# Patient Record
Sex: Female | Born: 1935 | Race: White | Hispanic: No | Marital: Married
Health system: Southern US, Community
[De-identification: ages and names within clinical notes are randomized; demographics above are authoritative.]

## PROBLEM LIST (undated history)

## (undated) DIAGNOSIS — J449 Chronic obstructive pulmonary disease, unspecified: Secondary | ICD-10-CM

## (undated) DIAGNOSIS — Z9981 Dependence on supplemental oxygen: Secondary | ICD-10-CM

## (undated) DIAGNOSIS — J45909 Unspecified asthma, uncomplicated: Secondary | ICD-10-CM

## (undated) DIAGNOSIS — C50919 Malignant neoplasm of unspecified site of unspecified female breast: Secondary | ICD-10-CM

## (undated) DIAGNOSIS — C3490 Malignant neoplasm of unspecified part of unspecified bronchus or lung: Secondary | ICD-10-CM

## (undated) HISTORY — PX: BREAST SURGERY: SHX581

## (undated) HISTORY — PX: APPENDECTOMY: SHX54

---

## 2004-01-20 ENCOUNTER — Ambulatory Visit: Payer: Self-pay | Admitting: Unknown Physician Specialty

## 2005-06-03 ENCOUNTER — Emergency Department: Payer: Self-pay | Admitting: Emergency Medicine

## 2005-09-18 ENCOUNTER — Ambulatory Visit: Payer: Self-pay

## 2005-09-21 ENCOUNTER — Ambulatory Visit: Payer: Self-pay

## 2005-10-01 ENCOUNTER — Ambulatory Visit: Payer: Self-pay | Admitting: Gerontology

## 2006-09-11 ENCOUNTER — Inpatient Hospital Stay: Payer: Self-pay | Admitting: Internal Medicine

## 2007-01-04 ENCOUNTER — Emergency Department: Payer: Self-pay | Admitting: Internal Medicine

## 2011-08-21 ENCOUNTER — Emergency Department: Payer: Self-pay | Admitting: Emergency Medicine

## 2011-08-21 LAB — URINALYSIS, COMPLETE
Bacteria: NONE SEEN
Blood: NEGATIVE
Glucose,UR: NEGATIVE mg/dL (ref 0–75)
Hyaline Cast: 3
Protein: NEGATIVE
RBC,UR: 7 /HPF (ref 0–5)
Specific Gravity: 1.015 (ref 1.003–1.030)

## 2011-08-21 LAB — COMPREHENSIVE METABOLIC PANEL
Albumin: 3.8 g/dL (ref 3.4–5.0)
Alkaline Phosphatase: 98 U/L (ref 50–136)
Anion Gap: 5 — ABNORMAL LOW (ref 7–16)
BUN: 17 mg/dL (ref 7–18)
Chloride: 103 mmol/L (ref 98–107)
Co2: 33 mmol/L — ABNORMAL HIGH (ref 21–32)
Creatinine: 0.61 mg/dL (ref 0.60–1.30)
EGFR (African American): >60
Glucose: 98 mg/dL (ref 65–99)
Osmolality: 283 (ref 275–301)
SGOT(AST): 24 U/L (ref 15–37)
Sodium: 141 mmol/L (ref 136–145)

## 2011-08-21 LAB — CBC
HCT: 44.1 % (ref 35.0–47.0)
HGB: 14.1 g/dL (ref 12.0–16.0)
MCH: 30.2 pg (ref 26.0–34.0)
MCHC: 32 g/dL (ref 32.0–36.0)
MCV: 95 fL (ref 80–100)
RDW: 13.6 % (ref 11.5–14.5)

## 2011-12-31 ENCOUNTER — Inpatient Hospital Stay: Payer: Self-pay | Admitting: Internal Medicine

## 2011-12-31 LAB — URINALYSIS, COMPLETE
Nitrite: NEGATIVE
Ph: 5 (ref 4.5–8.0)
Protein: NEGATIVE
RBC,UR: 1 /HPF (ref 0–5)
WBC UR: 7 /HPF (ref 0–5)

## 2011-12-31 LAB — PROTIME-INR: INR: 0.9

## 2011-12-31 LAB — COMPREHENSIVE METABOLIC PANEL
Albumin: 3.5 g/dL (ref 3.4–5.0)
Alkaline Phosphatase: 89 U/L (ref 50–136)
Anion Gap: 7 (ref 7–16)
BUN: 20 mg/dL — ABNORMAL HIGH (ref 7–18)
Calcium, Total: 8.3 mg/dL — ABNORMAL LOW (ref 8.5–10.1)
Co2: 29 mmol/L (ref 21–32)
EGFR (Non-African Amer.): >60
Glucose: 89 mg/dL (ref 65–99)
Potassium: 3.7 mmol/L (ref 3.5–5.1)
SGOT(AST): 24 U/L (ref 15–37)
SGPT (ALT): 21 U/L (ref 12–78)
Total Protein: 6.4 g/dL (ref 6.4–8.2)

## 2011-12-31 LAB — CBC
HGB: 11.7 g/dL — ABNORMAL LOW (ref 12.0–16.0)
MCH: 31.1 pg (ref 26.0–34.0)
MCHC: 33.4 g/dL (ref 32.0–36.0)
Platelet: 238 10*3/uL (ref 150–440)
RBC: 3.76 10*6/uL — ABNORMAL LOW (ref 3.80–5.20)
RDW: 13.7 % (ref 11.5–14.5)

## 2011-12-31 LAB — TSH: Thyroid Stimulating Horm: 1.22 u[IU]/mL

## 2012-01-01 LAB — BASIC METABOLIC PANEL
Calcium, Total: 8 mg/dL — ABNORMAL LOW (ref 8.5–10.1)
Chloride: 112 mmol/L — ABNORMAL HIGH (ref 98–107)
Creatinine: 0.56 mg/dL — ABNORMAL LOW (ref 0.60–1.30)
EGFR (African American): >60
EGFR (Non-African Amer.): >60
Glucose: 79 mg/dL (ref 65–99)
Sodium: 146 mmol/L — ABNORMAL HIGH (ref 136–145)

## 2012-01-01 LAB — CBC WITH DIFFERENTIAL/PLATELET
Basophil #: 0 10*3/uL (ref 0.0–0.1)
Basophil %: 0.3 %
Eosinophil #: 0.2 10*3/uL (ref 0.0–0.7)
Eosinophil %: 3.7 %
HCT: 30.9 % — ABNORMAL LOW (ref 35.0–47.0)
HGB: 9.8 g/dL — ABNORMAL LOW (ref 12.0–16.0)
Lymphocyte #: 1.7 10*3/uL (ref 1.0–3.6)
Lymphocyte %: 31.9 %
MCH: 29.9 pg (ref 26.0–34.0)
Monocyte %: 9.6 %
Neutrophil #: 2.8 10*3/uL (ref 1.4–6.5)
Platelet: 204 10*3/uL (ref 150–440)
RBC: 3.28 10*6/uL — ABNORMAL LOW (ref 3.80–5.20)
RDW: 13.5 % (ref 11.5–14.5)
WBC: 5.2 10*3/uL (ref 3.6–11.0)

## 2012-01-01 LAB — HEMOGLOBIN: HGB: 10.4 g/dL — ABNORMAL LOW (ref 12.0–16.0)

## 2012-01-02 LAB — CBC WITH DIFFERENTIAL/PLATELET
Basophil #: 0 10*3/uL (ref 0.0–0.1)
Basophil %: 0.5 %
Eosinophil #: 0.2 10*3/uL (ref 0.0–0.7)
Eosinophil %: 3.6 %
HCT: 30.2 % — ABNORMAL LOW (ref 35.0–47.0)
HGB: 9.9 g/dL — ABNORMAL LOW (ref 12.0–16.0)
Lymphocyte %: 28.7 %
Monocyte #: 0.5 x10 3/mm (ref 0.2–0.9)
Monocyte %: 8.1 %
Neutrophil #: 3.4 10*3/uL (ref 1.4–6.5)
Neutrophil %: 59.1 %
Platelet: 208 10*3/uL (ref 150–440)
RBC: 3.25 10*6/uL — ABNORMAL LOW (ref 3.80–5.20)
WBC: 5.7 10*3/uL (ref 3.6–11.0)

## 2012-01-02 LAB — BASIC METABOLIC PANEL
Calcium, Total: 8 mg/dL — ABNORMAL LOW (ref 8.5–10.1)
Co2: 28 mmol/L (ref 21–32)
EGFR (African American): >60
EGFR (Non-African Amer.): >60
Glucose: 84 mg/dL (ref 65–99)
Osmolality: 284 (ref 275–301)
Sodium: 144 mmol/L (ref 136–145)

## 2012-01-03 LAB — BASIC METABOLIC PANEL
Anion Gap: 8 (ref 7–16)
Calcium, Total: 8.4 mg/dL — ABNORMAL LOW (ref 8.5–10.1)
Chloride: 112 mmol/L — ABNORMAL HIGH (ref 98–107)
Co2: 28 mmol/L (ref 21–32)
Creatinine: 0.59 mg/dL — ABNORMAL LOW (ref 0.60–1.30)
EGFR (Non-African Amer.): >60
Glucose: 87 mg/dL (ref 65–99)
Osmolality: 292 (ref 275–301)
Potassium: 3.8 mmol/L (ref 3.5–5.1)
Sodium: 148 mmol/L — ABNORMAL HIGH (ref 136–145)

## 2012-01-03 LAB — CBC WITH DIFFERENTIAL/PLATELET
Basophil %: 0.5 %
Eosinophil %: 4.5 %
HCT: 30.1 % — ABNORMAL LOW (ref 35.0–47.0)
HGB: 10.3 g/dL — ABNORMAL LOW (ref 12.0–16.0)
Monocyte %: 9.1 %
Neutrophil %: 59.5 %
Platelet: 222 10*3/uL (ref 150–440)

## 2012-02-06 ENCOUNTER — Inpatient Hospital Stay: Payer: Self-pay | Admitting: Internal Medicine

## 2012-02-06 LAB — BASIC METABOLIC PANEL
Anion Gap: 5 — ABNORMAL LOW (ref 7–16)
Calcium, Total: 9.3 mg/dL (ref 8.5–10.1)
Chloride: 104 mmol/L (ref 98–107)
Co2: 33 mmol/L — ABNORMAL HIGH (ref 21–32)
Osmolality: 285 (ref 275–301)
Potassium: 3.5 mmol/L (ref 3.5–5.1)

## 2012-02-06 LAB — CBC
HCT: 31.2 % — ABNORMAL LOW (ref 35.0–47.0)
MCHC: 32.1 g/dL (ref 32.0–36.0)
MCV: 91 fL (ref 80–100)
Platelet: 318 10*3/uL (ref 150–440)
RBC: 3.41 10*6/uL — ABNORMAL LOW (ref 3.80–5.20)
RDW: 13.3 % (ref 11.5–14.5)
WBC: 7.1 10*3/uL (ref 3.6–11.0)

## 2012-02-06 LAB — TROPONIN I: Troponin-I: <0.02 ng/mL

## 2012-02-07 LAB — CBC WITH DIFFERENTIAL/PLATELET
Basophil #: 0 10*3/uL (ref 0.0–0.1)
Basophil %: 0.2 %
Eosinophil #: 0 10*3/uL (ref 0.0–0.7)
Eosinophil %: 0.1 %
HCT: 30.9 % — ABNORMAL LOW (ref 35.0–47.0)
HGB: 10.1 g/dL — ABNORMAL LOW (ref 12.0–16.0)
Lymphocyte #: 0.4 10*3/uL — ABNORMAL LOW (ref 1.0–3.6)
Lymphocyte %: 7.5 %
MCH: 29.4 pg (ref 26.0–34.0)
MCV: 90 fL (ref 80–100)
Monocyte #: 0 x10 3/mm — ABNORMAL LOW (ref 0.2–0.9)
Monocyte %: 0.8 %
Neutrophil #: 5 10*3/uL (ref 1.4–6.5)
Platelet: 310 10*3/uL (ref 150–440)
RBC: 3.43 10*6/uL — ABNORMAL LOW (ref 3.80–5.20)
WBC: 5.4 10*3/uL (ref 3.6–11.0)

## 2012-02-08 LAB — CBC WITH DIFFERENTIAL/PLATELET
Basophil #: 0 10*3/uL (ref 0.0–0.1)
Eosinophil #: 0 10*3/uL (ref 0.0–0.7)
HCT: 30.9 % — ABNORMAL LOW (ref 35.0–47.0)
Lymphocyte #: 1.7 10*3/uL (ref 1.0–3.6)
Lymphocyte %: 15.8 %
MCHC: 32.2 g/dL (ref 32.0–36.0)
Monocyte %: 10.3 %
Neutrophil #: 7.9 10*3/uL — ABNORMAL HIGH (ref 1.4–6.5)
Platelet: 330 10*3/uL (ref 150–440)
RBC: 3.39 10*6/uL — ABNORMAL LOW (ref 3.80–5.20)
RDW: 13.3 % (ref 11.5–14.5)
WBC: 10.7 10*3/uL (ref 3.6–11.0)

## 2012-04-14 ENCOUNTER — Emergency Department: Payer: Self-pay | Admitting: Emergency Medicine

## 2012-04-14 LAB — BASIC METABOLIC PANEL
Anion Gap: 5 — ABNORMAL LOW (ref 7–16)
Chloride: 107 mmol/L (ref 98–107)
Creatinine: 0.61 mg/dL (ref 0.60–1.30)
EGFR (African American): >60
EGFR (Non-African Amer.): >60
Glucose: 87 mg/dL (ref 65–99)
Osmolality: 284 (ref 275–301)
Potassium: 4 mmol/L (ref 3.5–5.1)
Sodium: 142 mmol/L (ref 136–145)

## 2012-04-14 LAB — URINALYSIS, COMPLETE
Bilirubin,UR: NEGATIVE
Glucose,UR: NEGATIVE mg/dL (ref 0–75)
Nitrite: NEGATIVE
Ph: 5 (ref 4.5–8.0)
Protein: NEGATIVE
RBC,UR: 3 /HPF (ref 0–5)
Squamous Epithelial: 2
WBC UR: 32 /HPF (ref 0–5)

## 2012-04-14 LAB — CBC
HCT: 37.2 % (ref 35.0–47.0)
HGB: 11.9 g/dL — ABNORMAL LOW (ref 12.0–16.0)
MCH: 28.3 pg (ref 26.0–34.0)

## 2012-04-14 LAB — TROPONIN I: Troponin-I: <0.02 ng/mL

## 2013-05-05 ENCOUNTER — Emergency Department: Payer: Self-pay | Admitting: Emergency Medicine

## 2013-10-10 ENCOUNTER — Ambulatory Visit: Payer: Self-pay | Admitting: Oncology

## 2013-11-05 ENCOUNTER — Inpatient Hospital Stay: Payer: Self-pay | Admitting: Internal Medicine

## 2013-11-05 LAB — COMPREHENSIVE METABOLIC PANEL
Albumin: 3.1 g/dL — ABNORMAL LOW (ref 3.4–5.0)
Alkaline Phosphatase: 92 U/L
Anion Gap: 9 (ref 7–16)
BUN: 16 mg/dL (ref 7–18)
Bilirubin,Total: 0.8 mg/dL (ref 0.2–1.0)
CALCIUM: 8.8 mg/dL (ref 8.5–10.1)
CHLORIDE: 101 mmol/L (ref 98–107)
Co2: 30 mmol/L (ref 21–32)
Creatinine: 0.78 mg/dL (ref 0.60–1.30)
EGFR (African American): >60
Glucose: 93 mg/dL (ref 65–99)
OSMOLALITY: 280 (ref 275–301)
Potassium: 3.6 mmol/L (ref 3.5–5.1)
SGOT(AST): 26 U/L (ref 15–37)
SGPT (ALT): 17 U/L
Sodium: 140 mmol/L (ref 136–145)
Total Protein: 7.4 g/dL (ref 6.4–8.2)

## 2013-11-05 LAB — CBC WITH DIFFERENTIAL/PLATELET
BASOS ABS: 0 10*3/uL (ref 0.0–0.1)
BASOS PCT: 0.4 %
EOS ABS: 0.1 10*3/uL (ref 0.0–0.7)
Eosinophil %: 0.6 %
HCT: 36.6 % (ref 35.0–47.0)
HGB: 11.3 g/dL — ABNORMAL LOW (ref 12.0–16.0)
LYMPHS ABS: 1.5 10*3/uL (ref 1.0–3.6)
Lymphocyte %: 12.2 %
MCH: 29.1 pg (ref 26.0–34.0)
MCHC: 30.9 g/dL — AB (ref 32.0–36.0)
MCV: 94 fL (ref 80–100)
Monocyte #: 1.3 x10 3/mm — ABNORMAL HIGH (ref 0.2–0.9)
Monocyte %: 10.2 %
NEUTROS ABS: 9.6 10*3/uL — AB (ref 1.4–6.5)
Neutrophil %: 76.6 %
Platelet: 215 10*3/uL (ref 150–440)
RBC: 3.89 10*6/uL (ref 3.80–5.20)
RDW: 13.8 % (ref 11.5–14.5)
WBC: 12.6 10*3/uL — AB (ref 3.6–11.0)

## 2013-11-05 LAB — CK TOTAL AND CKMB (NOT AT ARMC)
CK, Total: 126 U/L
CK-MB: 2.6 ng/mL (ref 0.5–3.6)

## 2013-11-05 LAB — LACTATE DEHYDROGENASE: LDH: 189 U/L (ref 81–246)

## 2013-11-05 LAB — TROPONIN I
TROPONIN-I: 0.06 ng/mL — AB
TROPONIN-I: 0.08 ng/mL — AB

## 2013-11-06 LAB — CBC WITH DIFFERENTIAL/PLATELET
Basophil #: 0 10*3/uL (ref 0.0–0.1)
Basophil %: 0.2 %
EOS ABS: 0.2 10*3/uL (ref 0.0–0.7)
Eosinophil %: 2.4 %
HCT: 32.2 % — AB (ref 35.0–47.0)
HGB: 10.6 g/dL — ABNORMAL LOW (ref 12.0–16.0)
LYMPHS PCT: 7.6 %
Lymphocyte #: 0.7 10*3/uL — ABNORMAL LOW (ref 1.0–3.6)
MCH: 30.9 pg (ref 26.0–34.0)
MCHC: 33 g/dL (ref 32.0–36.0)
MCV: 94 fL (ref 80–100)
MONO ABS: 1.1 x10 3/mm — AB (ref 0.2–0.9)
Monocyte %: 12.6 %
NEUTROS ABS: 6.9 10*3/uL — AB (ref 1.4–6.5)
Neutrophil %: 77.2 %
Platelet: 199 10*3/uL (ref 150–440)
RBC: 3.44 10*6/uL — ABNORMAL LOW (ref 3.80–5.20)
RDW: 14.1 % (ref 11.5–14.5)
WBC: 8.9 10*3/uL (ref 3.6–11.0)

## 2013-11-06 LAB — BODY FLUID CELL COUNT WITH DIFFERENTIAL
Basophil: 0 %
EOS PCT: 0 %
Lymphocytes: 3 %
NEUTROS PCT: 91 %
Nucleated Cell Count: 242 /mm3
OTHER MONONUCLEAR CELLS: 6 %
Other Cells BF: 0 %

## 2013-11-06 LAB — URINALYSIS, COMPLETE
Bacteria: NONE SEEN
Bilirubin,UR: NEGATIVE
Blood: NEGATIVE
GLUCOSE, UR: NEGATIVE mg/dL (ref 0–75)
Nitrite: NEGATIVE
PROTEIN: NEGATIVE
Ph: 5 (ref 4.5–8.0)
Specific Gravity: 1.02 (ref 1.003–1.030)
Squamous Epithelial: 2
WBC UR: 7 /HPF (ref 0–5)

## 2013-11-06 LAB — BASIC METABOLIC PANEL
ANION GAP: 7 (ref 7–16)
BUN: 16 mg/dL (ref 7–18)
CALCIUM: 8.5 mg/dL (ref 8.5–10.1)
CHLORIDE: 107 mmol/L (ref 98–107)
CREATININE: 0.67 mg/dL (ref 0.60–1.30)
Co2: 29 mmol/L (ref 21–32)
EGFR (African American): >60
Glucose: 86 mg/dL (ref 65–99)
Osmolality: 285 (ref 275–301)
Potassium: 3.6 mmol/L (ref 3.5–5.1)
Sodium: 143 mmol/L (ref 136–145)

## 2013-11-06 LAB — PROTEIN, BODY FLUID: Protein, Body Fluid: 1.8 g/dL

## 2013-11-06 LAB — LACTATE DEHYDROGENASE, PLEURAL OR PERITONEAL FLUID: LDH, Body Fluid: 75 U/L

## 2013-11-07 LAB — CBC WITH DIFFERENTIAL/PLATELET
Basophil #: 0 10*3/uL (ref 0.0–0.1)
Basophil %: 0.1 %
EOS PCT: 2.8 %
Eosinophil #: 0.2 10*3/uL (ref 0.0–0.7)
HCT: 34 % — ABNORMAL LOW (ref 35.0–47.0)
HGB: 10.6 g/dL — ABNORMAL LOW (ref 12.0–16.0)
Lymphocyte #: 1 10*3/uL (ref 1.0–3.6)
Lymphocyte %: 11.2 %
MCH: 29.8 pg (ref 26.0–34.0)
MCHC: 31.2 g/dL — ABNORMAL LOW (ref 32.0–36.0)
MCV: 96 fL (ref 80–100)
MONOS PCT: 11.9 %
Monocyte #: 1.1 x10 3/mm — ABNORMAL HIGH (ref 0.2–0.9)
NEUTROS PCT: 74 %
Neutrophil #: 6.7 10*3/uL — ABNORMAL HIGH (ref 1.4–6.5)
Platelet: 221 10*3/uL (ref 150–440)
RBC: 3.56 10*6/uL — AB (ref 3.80–5.20)
RDW: 13.9 % (ref 11.5–14.5)
WBC: 9 10*3/uL (ref 3.6–11.0)

## 2013-11-07 LAB — BASIC METABOLIC PANEL
Anion Gap: 5 — ABNORMAL LOW (ref 7–16)
BUN: 14 mg/dL (ref 7–18)
CHLORIDE: 104 mmol/L (ref 98–107)
CO2: 33 mmol/L — AB (ref 21–32)
Calcium, Total: 8.3 mg/dL — ABNORMAL LOW (ref 8.5–10.1)
Creatinine: 0.64 mg/dL (ref 0.60–1.30)
EGFR (African American): >60
EGFR (Non-African Amer.): >60
GLUCOSE: 92 mg/dL (ref 65–99)
Osmolality: 283 (ref 275–301)
Potassium: 3.6 mmol/L (ref 3.5–5.1)
Sodium: 142 mmol/L (ref 136–145)

## 2013-11-10 ENCOUNTER — Ambulatory Visit: Payer: Self-pay | Admitting: Oncology

## 2013-11-10 LAB — CULTURE, BLOOD (SINGLE)

## 2013-11-10 LAB — BODY FLUID CULTURE

## 2013-11-13 ENCOUNTER — Ambulatory Visit: Payer: Self-pay | Admitting: Oncology

## 2013-12-10 ENCOUNTER — Ambulatory Visit: Payer: Self-pay | Admitting: Oncology

## 2014-06-29 NOTE — Discharge Summary (Signed)
PATIENT NAME:  Savannah Mcintyre, Savannah Mcintyre MR#:  [id number] DATE OF BIRTH:  [birth year]  DATE OF ADMISSION:  12/31/2011 DATE OF DISCHARGE:  01/03/2012  DIAGNOSES AT TIME OF DISCHARGE:  1. Hematochezia and lower gastrointestinal bleed most likely secondary to diverticulitis.  2. Dehydration.  3. Anemia secondary to blood loss.  4. Hypothyroidism.  5. History of chronic obstructive pulmonary disease.  6. History of anxiety.   CHIEF COMPLAINT: Maroon-colored bloody stools.   HISTORY OF PRESENT ILLNESS: Savannah Mcintyre is an 81 year old female with a history of asthma, COPD, hypothyroidism, anxiety, depression, and hyperlipidemia who presented to the ER complaining of passage of burgundy-colored stools. She denies any diarrhea. The patient denies any hematemesis or melena. She has been taking [place] for the last few days. Her last colonoscopy done in 2005 showed evidence of internal hemorrhoids and diverticulosis.   PAST MEDICAL HISTORY:   1. Hypothyroidism.  2. History of pneumonia in the past. 3. Kidney stone.  4. Chronic obstructive pulmonary disease. 5. Asthma. 6. History of breast cancer, status post mastectomy.  7. Hyperlipidemia.  8. Osteoporosis.  9. Chronic headaches. 10. Gastroesophageal reflux. 11. Anxiety/depression.  12. Previous mastectomy.  13. Appendectomy.  14. Knee surgery.  15. Tonsillectomy and adenoidectomy.   PHYSICAL EXAMINATION: Temperature 98.1, heart rate 81, respirations 20, blood pressure 107/55. She was not in distress. HEENT normocephalic, atraumatic. Mucous membranes were moist. NECK was supple. No JVD. HEART S1, S2. LUNGS were clear to auscultation. ABDOMEN was soft, nontender. EXTREMITIES: No edema. NEUROLOGIC: Nonfocal.   LABORATORY, DIAGNOSTIC, AND RADIOLOGICAL DATA: Initial hemoglobin was 11.7, hematocrit 35.8, platelets 238, WBC count 6.9. CPK, troponin was normal. Glucose 89, BUN 20, creatinine 0.63, sodium 146, potassium 3.7, chloride 110, CO2 29. TSH  1.22. ALT 21, AST 24.   HOSPITAL COURSE: During her stay in the hospital, the patient was seen in consultation by gastroenterologist, Dr. Gustavo Lah. Her hemoglobin did drop to 9.8 and subsequently picked up to 10.3. She received intravenous fluids. Her stool color gradually improved although she did have some bloodstained maroon-colored stools. A bleeding scan was also done but did not show any active gastrointestinal bleeding. The patient was treated with IV Protonix and also intravenous fluids. Her diet was gradually advanced and she was able to tolerate low residue diet. Her renal function remained stable and she was allowed home in stable condition on the following medications.  DISCHARGE MEDICATIONS:  1. Protonix 40 mg p.o. daily.  2. Levothyroxine 88 mcg a day.  3. Paxil 10 mg once a day.  4. Combivent inhaler 2 puffs q.i.d. p.r.n.  5. Flovent HFA 110 mcg 1 puff b.i.d. 6. Omega-3 polyunsaturated fatty acids 1 capsule b.i.d.   DIET: The patient is advised a low residue diet.   FOLLOW-UP:  1. She was advised to return to clinic in 1 to 2 weeks for follow-up.  2. She will have a colonoscopy done as an outpatient by Dr. Gustavo Lah in 2 to 4 weeks' time.  3. The patient has been advised to go to the ER if she has any worsening GI bleed or if she feels lightheaded, dizzy, or unwell in any way.    Total time spent in discharge and coordination of care: 35 minutes ____________________________ Tracie Harrier, MD vh:drc D: 01/03/2012 17:15:29 ET T: 01/04/2012 10:22:13 ET JOB#: [id number] Tracie Harrier MD ELECTRONICALLY SIGNED 01/08/2012 13:13

## 2014-06-29 NOTE — Discharge Summary (Signed)
PATIENT NAME:  Savannah Mcintyre, Savannah Mcintyre MR#:  [id number] DATE OF BIRTH:  12/08/1920  DATE OF ADMISSION:  02/06/2012 DATE OF DISCHARGE:  02/08/2012  DIAGNOSES AT TIME OF DISCHARGE:  1. Chronic obstructive pulmonary disease exacerbation, hypoxemia.  2. Hypothyroidism.  3. Depression.   CHIEF COMPLAINT: Shortness of breath.   HISTORY OF PRESENT ILLNESS:  Savannah Mcintyre is an 81 year old female who presented to the ER complaining of shortness of breath, cough with sputum production, and was noted to be hypoxemic in the Emergency Room. Symptoms symptomatically responded to oxygen and a dose of IV Solu-Medrol.   PAST MEDICAL HISTORY: Significant for chronic obstructive pulmonary disease, hypothyroidism, depression, gastroesophageal reflux disease, and osteoporosis.   PAST SURGICAL HISTORY: Significant for right radical mastectomy, history of  appendectomy, history of tonsillectomy and adenoidectomy, history of right knee surgery.   PHYSICAL EXAMINATION:  Temperature 98.1, blood pressure 107/56, pulse 78, respirations 22, oxygen saturation 97% on 2 liters nasal cannula. She was alert and oriented, not in distress. Neck was supple.  Heart: S1, S2.  Lungs: Decreased air entry in both lung fields with bilateral wheezing.  No crackles heard. Abdomen: Soft, nontender.  Extremities: No edema.  LABS/STUDIES: WBC count 7.1, hemoglobin 10, platelets 318, sodium 142, potassium 3.5, BUN 12, creatinine 0.73, glucose 130. Chest x-ray showed evidence of chronic obstructive pulmonary disease with fibrosis and presumed bibasilar atelectasis. Minimal bibasilar pneumonia was less likely. There was also evidence of osteopenia with bony degenerative changes present.   HOSPITAL COURSE: The patient was also started on doxycycline and continued on nebulized bronchodilator therapy and Solu-Medrol. She was continued on benzonatate as well as fluticasone nasal spray. Prednisone was subsequently tapered and she was discharged in stable  condition on the following medications.   DISCHARGE MEDICATIONS:  1. Paxil 10 mg p.o. daily.  2. Levothyroxine 88 mcg a day.  3. Omega-3 fish oil 1 capsule 2 times week.  4. Combivent inhaler 1 puff 4 to 5 times a day.  5. Advair Diskus 250/50, 1 puff twice a day.  6. Doxycycline 100 mg p.o. b.i.d. for one more week.  7. Prednisone taper starting at 40 mg a day for three days, decrease by 10 mg every three days until  gone.  8. Oxygen 2 liters nasal cannula.   FOLLOWUP: The patient has been advised to follow up with me, Dr. Ginette Pitman, in 1 to 2 weeks' time.   Total time spent in discharge of pt and co ordination of care: 40 minutes   ____________________________ Tracie Harrier, MD vh:bjt D: 02/11/2012 13:28:16 ET T: 02/11/2012 13:39:33 ET JOB#: [id number]  cc: Tracie Harrier, MD, <Dictator> Tracie Harrier MD ELECTRONICALLY SIGNED 02/12/2012 8:22

## 2014-06-29 NOTE — H&P (Signed)
PATIENT NAME:  Savannah Mcintyre, Savannah Mcintyre MR#:  [id number] DATE OF BIRTH:  [birth year]  DATE OF ADMISSION:  12/31/2011  REFERRING PHYSICIAN: ER physician, Dr. Jasmine December   PRIMARY CARE PHYSICIAN: Dr. Ginette Pitman    GASTROENTEROLOGIST Dr. Vira Agar    CHIEF COMPLAINT: Hematochezia.    HISTORY OF PRESENT ILLNESS: The patient is an 81 year old female with past medical history of asthma, COPD, hypothyroidism, anxiety, depression, and hyperlipidemia who reports that she takes [place] once or twice almost daily for chronic headaches, back, neck and joint pain. She denies ever having GI bleed in the past. For the past three days she has been noticing that every time she goes to the bathroom she is having dark burgundy stools seeping blood into the bowl. She denies any diarrhea. Her usual regimen is going to the bathroom once or twice daily and that is what she has been doing for the last 2 to 3 days. Denies any hematemesis or melena. Denies any nausea, vomiting, abdominal pain. Her last colonoscopy was done in 2005 which showed internal hemorrhoids and diverticulosis.   PAST MEDICAL HISTORY: 1. Hypothyroidism. 2. History of pneumonia in the past.  3. Kidney stones. 4. Chronic obstructive pulmonary disease.  5. Asthma.  6. History of breast cancer, status post mastectomy.  7. Hyperlipidemia.  8. Osteoporosis.  9. Chronic headaches. 10. Gastroesophageal reflux disease. 11. Anxiety/depression.   PAST SURGICAL HISTORY:  1. Mastectomy.  2. Appendectomy.  3. Knee surgery.  4. Tonsillectomy. 5. Adenoidectomy.   FAMILY HISTORY: Mother died of complications from a CVA at the age of 61. Father committed suicide.   SOCIAL HISTORY: She lives alone but her daughter lives nearby. The patient quit smoking 10 to 15 years ago. Denies any alcohol or drug abuse.   ALLERGIES: Penicillin, Darvon, codeine.   CURRENT MEDICATIONS:  1. Combivent 1 puff inhaled 4 times a day. 2. Flovent 110 mcg inhaled b.i.d.  3. Synthroid  88 mcg once a day.  4. Omega-3 Fish Oil 1 capsule twice a week.  5. Paxil 10 mg daily.   REVIEW OF SYSTEMS: CONSTITUTIONAL: Denies any fever or fatigue. Reports weakness. EYES: Denies any blurred or double vision. ENT: Denies any tinnitus, ear pain. RESPIRATORY: Denies any cough, wheezing. CARDIOVASCULAR: Denies any chest pain, palpitations. GI: Reports rectal bleeding. Denies any nausea, vomiting, diarrhea, abdominal pain. GU: Denies any dysuria or hematuria. ENDOCRINE: Denies any polyuria or nocturia. HEME/LYMPH: Denies any anemia or easy bruisability. INTEGUMENTARY: Denies any acne or rash. MUSCULOSKELETAL: Denies any swelling or gout. NEUROLOGICAL: Denies any numbness or weakness. PSYCH: Has history of anxiety/depression.   PHYSICAL EXAMINATION:   VITAL SIGNS: Temperature 98.1, heart rate 81, respiratory rate 20, blood pressure 107/55.   GENERAL: The patient is an elderly Caucasian female laying comfortably in bed not in acute distress.   HEAD: Atraumatic, normocephalic.   EYES: There is some pallor. No icterus or cyanosis. Pupils equal, round, and reactive to light and accommodation. Extraocular movements intact.    ENT: Wet mucous membranes. No oropharyngeal erythema or thrush.   NECK: Supple. No masses. No JVD. No thyromegaly or lymphadenopathy.   CHEST WALL: No tenderness to palpation. Not using accessory muscles of respiration. No intercostal muscle retractions.   LUNGS: Bilaterally clear to auscultation. No wheezing, rales, or rhonchi.   CARDIOVASCULAR: Regular. No murmur, rubs, or gallops.   ABDOMEN: Soft, nontender, nondistended. No guarding. No rigidity. No organomegaly. Normal bowel sounds.   SKIN: No rashes or lesions.   PERIPHERIES: No pedal edema. 2+ pedal pulses.  MUSCULOSKELETAL: No cyanosis or clubbing.   NEUROLOGIC: Awake, alert, oriented x3. Nonfocal neurological exam. Cranial nerves grossly intact.   PSYCH: Normal mood and affect.   LABORATORY,  DIAGNOSTIC, AND RADIOLOGICAL DATA: Urinalysis shows no evidence of infection. CBC shows hemoglobin of 11.7, hematocrit 35.8, platelets 238, white count 6.9. CK, troponin normal. Glucose 89, BUN 20, creatinine 0.63, sodium 146, potassium 3.7, chloride 110, CO2 29, calcium 8.3, bilirubin 0.5, alkaline phosphatase 89, ALT 21, ALT 24, total protein 6.4. TSH 1.22.   ASSESSMENT AND PLAN: The patient is an 81 year old female with past medical history of hypothyroidism, COPD, anxiety/depression who presents with hematochezia.  1. Hematochezia/lower GI bleed. The patient reports that she takes [place] once or twice a day almost daily and has done so for several years. She denies having any prior episodes of GI bleed. Her last colonoscopy in 2005 had shown internal hemorrhoids and diverticulosis. Her last hemoglobin in June of 2013 was 14.1. Currently it has dropped to 11.7. Her BUN is also elevated. Will admit her to the hospital. Place her on a clear liquid diet. Give her some IV fluids. IV PPI. The ER physician has discussed with Dr. Gustavo Lah who recommended GI bleeding scan which has been ordered. Will give her a clear liquid diet for the time being and monitor her hemoglobin and hematocrit closely. The patient has been advised to discontinue using [employer].  2. Mild dehydration with elevated BUN, could be related to her GI bleed. Will gently hydrate her with fluids.  3. Mild hypernatremia. Will monitor closely.  4. Hypothyroidism. The patient's TSH is normal. Will continue her current dose of Synthroid. 5. Asthma/COPD, appears to be stable at present with no evidence of wheezing. Will continue her inhalers including Combivent and Flovent. 6. History of anxiety/depression. Will continue her Paxil.  7. GI prophylaxis will be with IV PPI. 8. DVT prophylaxis will be with SCDs and TEDs.   Reviewed old medical records, discussed with the ED physician, discussed with the patient and her daughter the plan of  care and management.   TIME SPENT: 75 minutes.   ____________________________ Cherre Huger, MD sp:drc D: 12/31/2011 13:01:06 ET T: 12/31/2011 13:16:11 ET JOB#: [id number]  cc: Cherre Huger, MD, <Dictator> Tracie Harrier, MD Cherre Huger MD ELECTRONICALLY SIGNED 12/31/2011 19:33

## 2014-06-29 NOTE — H&P (Signed)
PATIENT NAME:  Savannah Mcintyre, Savannah Mcintyre MR#:  [id number] DATE OF BIRTH:  [birth year]  DATE OF ADMISSION:  02/06/2012  CHIEF COMPLAINT: Shortness of breath.   PRIMARY CARE PHYSICIAN: Dr. Tracie Harrier, [place]: This is an 81 year old female who came in complaining of several days worsening of shortness of breath, sputum production, and cough with underlying COPD. She was hypoxemic in the ER. She responded to O2 and a dose of Solu-Medrol. Currently she is not complaining of any chest discomfort but still short of breath and still complaining of cough and sputum production.   REVIEW OF SYSTEMS: Denies any fever, chest pain, dizziness, weakness.   PAST MEDICAL HISTORY:  1. Chronic obstructive pulmonary disease.  2. Hypothyroidism. 3. Depression.  4. Gastroesophageal reflux disease.  5. Osteoporosis.   PAST SURGICAL HISTORY:  1. History of right radical mastectomy.  2. History of appendectomy.  3. History of T and A.   4. History of right knee surgery.   MEDICATIONS:  1. ProAir HFA 2 puffs q.4 hours p.r.n. for wheezing.  2. Levothyroxine 88 mcg p.o. daily.  3. Paroxetine 10 mg p.o. daily.  4. Advair 250/50 p.o. b.i.d.   ALLERGIES: Cipro, cortisone, Darvon, Flagyl, Novocain, penicillin.   FAMILY HISTORY: Noncontributory.   SOCIAL HISTORY: She lives at home alone. Former smoker, quit 10 years ago. No alcohol use.   CODE STATUS: She is a FULL CODE.   REVIEW OF SYSTEMS: As stated above.   PHYSICAL EXAMINATION:   VITAL SIGNS: Temperature 98.1, blood pressure 107/56, pulse 78, respiratory rate 22, 97% on 2 liters.   GENERAL: This is an elderly female who is alert and oriented in no acute distress.   HEENT: Extraocular movements intact. Pupils equal and reactive to light and accommodation. Vision grossly intact.   NECK: Supple. No lymphadenopathy.   CARDIOVASCULAR: Regular rate and rhythm. No murmurs, rubs, or gallops.   RESPIRATORY: Decreased  breath sounds in lower lung fields, wheezing throughout. No crackles heard. No increased work of breathing. No use of accessory muscles.   ABDOMEN: Soft, nontender, nondistended.   SKIN: No cyanosis.   NEUROLOGIC: Alert and oriented x3. Cranial nerves II through XII grossly intact. No focal deficits.   PERTINENT LABS: Chest x-ray did show COPD with fibrosis. No specific infiltrate was noted.   Cardiac enzymes were negative x1. White blood cell count 7.1, hemoglobin 10, platelets 318, sodium 142, potassium 3.5, BUN 12, creatinine 0.73, glucose 130.     ASSESSMENT AND PLAN:  1. COPD exacerbation. The patient is being admitted with COPD exacerbation with a several day history of symptoms that are worsening. Three days ago she was placed on azithromycin by her primary care physician. She is on day three of five with no improvement. No fevers here. Plan to admit her and place her on doxycycline 100 mg p.o. b.i.d. Unable to place her on other medications because of the extensive allergy list. I will also place her on oral prednisone 60 mg and taper as needed at this time. Will receive Xopenex breathing treatments with her questionable history of albuterol intolerance.  2. Hypothyroidism. Continue home regimen of 88 mcg.  3. History of depression. Retain her on her home regimen of paroxetine.   DISPOSITION: She is in fair condition to be admitted to the medical floor. No tele needed. She is a FULL CODE. Anticipate at least a two day stay for her.   ____________________________ Dion Body, MD kl:drc D: 02/06/2012 18:20:17 ET T:  02/07/2012 07:28:15 ET JOB#: [id number]  cc: Dion Body, MD, <Dictator> Dion Body MD ELECTRONICALLY SIGNED 02/20/2012 12:17

## 2014-06-29 NOTE — Consult Note (Signed)
Chief Complaint:   Subjective/Chief Complaint several small bloody bm today.  denies abdominal pain or nausea.  tolerating clears.   VITAL SIGNS/ANCILLARY NOTES: **Vital Signs.:   22-Oct-13 16:12   Vital Signs Type Post-Procedure   Pulse Pulse 76   Respirations Respirations 18   Systolic BP Systolic BP 219   Diastolic BP (mmHg) Diastolic BP (mmHg) 69   Mean BP 86   Pulse Ox % Pulse Ox % 92   Pulse Ox Activity Level  At rest   Oxygen Delivery Room Air/ 21 %   Brief Assessment:   Cardiac Regular    Respiratory clear BS    Gastrointestinal details normal Soft  Nontender  Nondistended  Bowel sounds normal   Lab Results: Routine Chem:  22-Oct-13 04:09    Glucose, Serum 79   BUN 13   Creatinine (comp)  0.56   Sodium, Serum  146   Potassium, Serum 3.7   Chloride, Serum  112   CO2, Serum 29   Calcium (Total), Serum  8.0   Anion Gap  5   Osmolality (calc) 290   eGFR (African American) >60   eGFR (Non-African American) >60 (eGFR values <45m/min/1.73 m2 may be an indication of chronic kidney disease (CKD). Calculated eGFR is useful in patients with stable renal function. The eGFR calculation will not be reliable in acutely ill patients when serum creatinine is changing rapidly. It is not useful in  patients on dialysis. The eGFR calculation may not be applicable to patients at the low and high extremes of body sizes, pregnant women, and vegetarians.)  Routine Hem:  22-Oct-13 04:09    Hemoglobin (CBC)  9.8   WBC (CBC) 5.2   RBC (CBC)  3.28   Hematocrit (CBC)  30.9   Platelet Count (CBC) 204   MCV 94   MCH 29.9   MCHC  31.7   RDW 13.5   Neutrophil % 54.5   Lymphocyte % 31.9   Monocyte % 9.6   Eosinophil % 3.7   Basophil % 0.3   Neutrophil # 2.8   Lymphocyte # 1.7   Monocyte # 0.5   Eosinophil # 0.2   Basophil # 0.0 (Result(s) reported on 01 Jan 2012 at 05:41AM.)    12:00    Hemoglobin (CBC)  10.4 (Result(s) reported on 01 Jan 2012 at 12:37PM.)    Assessment/Plan:  Assessment/Plan:   Assessment 1)  hematochezia-after repeat bloody bm today, bleeding scan negative.  Rectal exam this evening showing old blood, likely c/w residual from previous bleeding.  No obvious lesion on DRE.  probable diverticular bleeding, stable.    Plan 1) continue observation, daily hgb, transfuse as needed.  Patient will need to have colonoscopy in a couple of weeks unless continuing to have bleeding.  Following.   Electronic Signatures: SLoistine Simas(MD)  (Signed 22-Oct-13 22:29)  Authored: Chief Complaint, VITAL SIGNS/ANCILLARY NOTES, Brief Assessment, Lab Results, Assessment/Plan   Last Updated: 22-Oct-13 22:29 by SLoistine Simas(MD)

## 2014-06-29 NOTE — Consult Note (Signed)
Chief Complaint:   Subjective/Chief Complaint no evidence of recurrent bleeding.  no n/v or abdominal pain.   VITAL SIGNS/ANCILLARY NOTES: **Vital Signs.:   23-Oct-13 04:20   Vital Signs Type Routine   Temperature Temperature (F) 97.9   Celsius 36.6   Temperature Source Oral   Pulse Pulse 81   Respirations Respirations 18   Systolic BP Systolic BP 700   Diastolic BP (mmHg) Diastolic BP (mmHg) 71   Mean BP 86   Pulse Ox % Pulse Ox % 92   Pulse Ox Activity Level  At rest   Oxygen Delivery Room Air/ 21 %   Brief Assessment:   Cardiac Regular    Respiratory clear BS    Gastrointestinal details normal Soft  Nontender  Nondistended  No masses palpable  Bowel sounds normal   Lab Results: Routine Chem:  23-Oct-13 04:26    Glucose, Serum 84   BUN 8   Creatinine (comp)  0.51   Sodium, Serum 144   Potassium, Serum  3.1   Chloride, Serum  109   CO2, Serum 28   Calcium (Total), Serum  8.0   Anion Gap 7   Osmolality (calc) 284   eGFR (African American) >60   eGFR (Non-African American) >60 (eGFR values <31m/min/1.73 m2 may be an indication of chronic kidney disease (CKD). Calculated eGFR is useful in patients with stable renal function. The eGFR calculation will not be reliable in acutely ill patients when serum creatinine is changing rapidly. It is not useful in  patients on dialysis. The eGFR calculation may not be applicable to patients at the low and high extremes of body sizes, pregnant women, and vegetarians.)  Routine Hem:  21-Oct-13 17:56    Hemoglobin (CBC)  11.6 (Result(s) reported on 31 Dec 2011 at 06:18PM.)  22-Oct-13 04:09    Hemoglobin (CBC)  9.8    12:00    Hemoglobin (CBC)  10.4 (Result(s) reported on 01 Jan 2012 at 12:37PM.)  23-Oct-13 04:26    WBC (CBC) 5.7   RBC (CBC)  3.25   Hemoglobin (CBC)  9.9   Hematocrit (CBC)  30.2   Platelet Count (CBC) 208   MCV 93   MCH 30.4   MCHC 32.7   RDW 13.6   Neutrophil % 59.1   Lymphocyte % 28.7   Monocyte %  8.1   Eosinophil % 3.6   Basophil % 0.5   Neutrophil # 3.4   Lymphocyte # 1.6   Monocyte # 0.5   Eosinophil # 0.2   Basophil # 0.0 (Result(s) reported on 02 Jan 2012 at 08:56AM.)   Assessment/Plan:  Assessment/Plan:   Assessment 1) hemaotchezia-stable, no bm for 24 hours.  no abdominal pain.  likely diverticular.    Plan 1) advance diet to low residue in am, continue for a week.  recommend GI followup as o/p, colonoscopy as op in 4 weeks or so.  discussed with Dr HGinette Pitman   Electronic Signatures: SLoistine Simas(MD)  (Signed 23-Oct-13 13:04)  Authored: Chief Complaint, VITAL SIGNS/ANCILLARY NOTES, Brief Assessment, Lab Results, Assessment/Plan   Last Updated: 23-Oct-13 13:04 by SLoistine Simas(MD)

## 2014-06-29 NOTE — Consult Note (Signed)
Brief Consult Note: Diagnosis: GI bleed.   Patient was seen by consultant.   Consult note dictated.   Comments: Appreciate consult for 81 y/o caucasian woman with onset of burgundy stools 3d ago. States they are formed, and she has no abdominal pain, NVD, constipation, or other Gi complaint, other than longstanding dysphagia, with tablets, which she attributes to her inhalers, and occasional indigestion relieved with Tums. Last stool 0600 today. Does state she takes [facility] or [employer] powders bid for headaches. Colonoscopy 2005: this was incomplete to the transverse colon, but did demonstrate hemorrhoids and diverticula. States she had and EGD remotely, cannot remember the results.  Impression and plan: Melena. Hgb stable, hemodynamically stable. Discussed with Dr Gustavo Lah, and as it has been several hours since her last bleeding episode, will hold on GIB scan for now, but would reorder if it recurs. Did discuss GI side effects of NSAIDs with patient, and advised against them. Do recommend serial hemoglobins, PPI or H2RA therapy, and considering Es-gic for headaches instead of BCs. Will follow. Thanks you for this consult.  Electronic Signatures: Stephens November H (NP)  (Signed 21-Oct-13 16:09)  Authored: Brief Consult Note   Last Updated: 21-Oct-13 16:09 by Theodore Demark (NP)

## 2014-06-29 NOTE — Consult Note (Signed)
Chief Complaint:   Subjective/Chief Complaint Please see full GI consult.  Patient admitted with drop of hgb and rectal bleeding/hematochezia. No recurrence over the course of the day.   Denies abdominal pain.  GI bleeding scan held due to length of time without evidence of bleeding reducing possibility of helpful scan.  If ther is recurrent bleeding as evidenced by drop of hgb and episodes of hematochezia, would do bleeding scan. If positive,  would get Vascular Surg consult for consideration of microembolization if consistant with diverticular bleeding.  Serial hgb, transfuse as needed, continue ppi for ulcer prophy. History of bc powder use noted.  Following.   Electronic Signatures: Loistine Simas (MD)  (Signed 21-Oct-13 17:52)  Authored: Chief Complaint   Last Updated: 21-Oct-13 17:52 by Loistine Simas (MD)

## 2014-06-29 NOTE — Consult Note (Signed)
PATIENT NAME:  Savannah Mcintyre, Savannah Mcintyre MR#:  [id number] DATE OF BIRTH:  1921/01/31  DATE OF CONSULTATION:  12/31/2011  REFERRING PHYSICIAN:  Dr. Karsten Fells  CONSULTING PHYSICIAN:  Loistine Simas, MD/Dreama Kuna Orrin Brigham, NP  HISTORY OF PRESENT ILLNESS: Ms. Prue is a very pleasant 81 year old Caucasian woman who has been admitted with an onset of burgundy stools three days ago. GI has been requested to evaluate the patient by Dr. Karsten Fells for evaluation of the same. The patient states they are formed stools. She has no abdominal pain, nausea, vomiting, diarrhea, constipation, or other GI complaint other than longstanding dysphagia with tablets which she attributes to her inhalers and occasional indigestion relieved with TUMS. Her last stool was at 06:00 today. Does state she takes a BC or Goody's Powder twice a day for intermittent headaches. She did undergo colonoscopy in 2005. This was incomplete to the transverse colon but did demonstrate hemorrhoids and diverticula. She states she had an EGD remotely, cannot remember the results and I cannot find them in the computer.   PAST MEDICAL HISTORY:  1. Hypothyroidism.  2. Pneumonia. 3. Kidney stone. 4. Chronic obstructive pulmonary disease. 5. Asthma. 6. Breast cancer, status post mastectomy in 1984.  7. Hyperlipidemia.  8. Osteoporosis.  9. Chronic headaches. 10. Gastroesophageal reflux disease.   11. Anxiety/depression.  PAST SURGICAL HISTORY:  1. Mastectomy.  2. Appendectomy.  3. Knee surgery consisting of cartilage removal.  4. Tonsillectomy and adenoidectomy.  FAMILY HISTORY: Denies history of colorectal cancer, colon polyps, liver disease, ulcers. Mother deceased from CVA. Father deceased due to suicide.   SOCIAL HISTORY: Lives alone. Daughter lives nearby. Quit smoking somewhere around the year 2000. No alcohol or illicits.   ALLERGIES: Penicillin, Darvon, codeine.   CURRENT MEDICATIONS:  1. Combivent 1 puff q.i.d.  2. Flovent 110 mcg  b.i.d.  3. Synthroid 88 mcg daily.  4. Omega-3 Fish Oil one capful twice weekly. 5. Paxil 10 mg p.o. daily.  6. BC Powders as noted above.   REVIEW OF SYSTEMS: 10 systems reviewed and unremarkable other than what is noted above other than history of shortness of breath consistent with COPD. States this has neither increased nor decreased recently and feels it is very stable. Also has some bilateral knee pain. States this is stable as well.   MOST RECENT LAB WORK: Glucose 89, BUN 20, last BUN in June of 2013 was 17, creatinine 0.63, sodium 146, potassium 3.7, chloride 110, GFR greater than 60, calcium 8.3, total serum protein 6.4, albumin 3.5, total bilirubin 0.5, ALP 89, AST 24, ALT 21. CK 88. CPK-MB 2.7. Troponin less than 0.02. TSH 1.22. WBC 6.9, hemoglobin 11.7, hematocrit 35, platelet count 238. Red cells are normocytic with normal distribution. PT 12.7. INR 0.9.   PHYSICAL EXAMINATION:   MOST RECENT VITAL SIGNS: Temperature 97.5, pulse 85, respiratory rate 18, blood pressure 122/60, SAO2 91% on room air.   GENERAL: Elderly Caucasian woman lying comfortably in bed in no acute distress.   HEENT: Head atraumatic, normocephalic. No redness, drainage, or inflammation to the eyes or the nares. Oral mucous membranes are pink and moist.   NECK: No abnormalities.   RESPIRATORY: Respirations eupneic. Lungs clear bilaterally.   CARDIAC: S1, S2. RRR. No MRG. Peripheral pulses 2+ to all extremities. No appreciable edema.   ABDOMEN: Bowel sounds x4, very soft, nondistended, nontender. No guarding, rigidity, peritoneal signs, rebound tenderness, hepatosplenomegaly, or other issues.   GU: Deferred.   RECTAL: Deferred.   SKIN: Warm, dry, pink. No erythema, lesion,  or rash.   EXTREMITIES: MAEW x4. Strength 5 out of 5. Sensation appears to be intact. No clubbing or cyanosis.   SKIN: Warm, dry, pink. No erythema, lesion, or rash.   NEUROLOGIC: Cranial nerves II through XII grossly intact. Speech  clear. No facial droop. Alert and oriented x3.   PSYCH: Pleasant, cooperative, good memory skills.   IMPRESSION AND PLAN: Melena, hemoglobin stable, hemodynamically stable. Discussed with Dr. Gustavo Lah and as it has been several hours since her last bleeding episode will hold GI bleed scan for now but would reorder if it recurs. Did discuss GI side effects of NSAIDs with the patient and advised against them. This is to include BC and Goody Powders. Do recommend serial hemoglobins, PPI, or H2RA therapy and considering Es-Gesic for headaches instead of BC Powders.  Will follow with you. Thank you for this consult.   These services were provided by Stephens November, MSN, [place] in collaboration with Loistine Simas, MD with whom I have discussed this patient in full.   ____________________________ Theodore Demark, NP chl:drc D: 12/31/2011 16:16:34 ET T: 12/31/2011 16:36:18 ET JOB#: [id number] cc: Theodore Demark, NP, <Dictator> [place] SIGNED 01/16/2012 8:18

## 2014-07-03 NOTE — H&P (Signed)
PATIENT NAME:  Savannah Mcintyre, WALDROUP MR#:  [id number] DATE OF BIRTH:  04/01/1920  DATE OF ADMISSION:  11/05/2013  PRIMARY CARE PHYSICIAN: Tracie Harrier, MD  CHIEF COMPLAINT: Shortness of breath.   HISTORY OF PRESENT ILLNESS: This is a 81 year old female with end-stage COPD, on chronic oxygen, hypothyroidism, depression, gastroesophageal reflux disease. She presents for a few days of difficulty breathing, wheezing, coughing, pale phlegm, felt like asthma, bronchitis to an ill feeling, also describes some chest pain on the right side, constant in nature, not too severe. She states that she also had a fever.  In the ER, she was found to be in acute respiratory failure, put on 100% nonrebreather, rapid atrial fibrillation status post IV diltiazem pushes, still with heart rate in the 150s.  Chest x-ray showed a left lung mass and right pleural effusion.  Hospitalist services were contacted for further evaluation.   PAST MEDICAL HISTORY: End-stage COPD, on chronic oxygen, hypothyroidism, depression, gastroesophageal reflux disease, osteoporosis.   PAST SURGICAL HISTORY: Right radical mastectomy. The patient states that this was not cancer.  Appendectomy, tonsils and adenoids, right knee surgery.   ALLERGIES: CIPRO, CORTISONE, DARVON, FLAGYL, NOVOCAIN, PENICILLIN.   FAMILY HISTORY: Father died of alcohol-related issues. Mother died of a stroke.   SOCIAL HISTORY: Lives alone, former smoker.  No alcohol. No drug use. Used to do office work in the past.   MEDICATIONS: As per prescription Probation officer include Advair Diskus 250/50 at 1 puff twice a day, Combivent CFC 1 puff every 6 hours, Flovent 110 mcg 1 puff twice a day, furosemide 20 mg daily, levothyroxine 88 mcg daily, Paxil 10 mg daily.   REVIEW OF SYSTEMS:  CONSTITUTIONAL: Positive for fever. No chills, no sweats. Positive for fatigue.  EYES: No eye issues.  EARS, NOSE, MOUTH AND THROAT: Decreased hearing, positive runny nose. Positive for sore throat.   CARDIOVASCULAR: Positive for chest pain. Positive palpitations.  RESPIRATORY: Positive for shortness of breath. Positive for cough, phlegm pale color, wheeze.  GASTROINTESTINAL: Positive for nausea and vomiting, positive for abdominal pain. No diarrhea. No constipation. No bright red blood per rectum. No melena.   GENITOURINARY: No burning on urination or hematuria.  MUSCULOSKELETAL: Positive for joint pain.  NEUROLOGIC: No fainting or blackouts.  PSYCHIATRIC: On medication for depression.  ENDOCRINE: Positive for hypothyroidism.  HEMATOLOGIC AND LYMPHATIC: No anemia.   PHYSICAL EXAMINATION:  VITAL SIGNS: Last included temperature 98.1, pulse 154, respirations 35, blood pressure 115/57, pulse oximetry 99% on 100% nonrebreather.  GENERAL: No respiratory distress.  EYES: Conjunctivae and lids normal. Pupils equal, round, and reactive to light. Extraocular muscles intact. No nystagmus.  EARS, NOSE, MOUTH AND THROAT: Tympanic membranes: No erythema. Nasal mucosa.  Throat with light erythema, no exudate seen. Lips and gums: No lesions.  NECK: No JVD. No bruits. No lymphadenopathy. No thyromegaly. No thyroid nodules palpated.  RESPIRATORY:  Lungs with decreased breath sounds bilaterally. Expiratory wheeze at the upper lung fields. Decreased breath sounds bilateral lung bases, right more distant than the left.  ABDOMEN: Soft, nontender. No organosplenomegaly.  Normoactive bowel sounds. No masses felt.  LYMPHATIC: No lymph nodes in the neck.  MUSCULOSKELETAL: No clubbing. No cyanosis. Trace edema.  CARDIOVASCULAR: S1 and S2, tachycardic, irregularly irregular. Carotid upstroke 2+ bilaterally. Dorsalis pedis pulses 1+ bilaterally. Trace edema of the lower extremity.  SKIN: No ulcers seen.  NEUROLOGIC: Cranial nerves II through XII grossly intact. Deep tendon reflexes 2+ bilateral lower extremities.  PSYCHIATRIC: The patient is oriented to person, place, and time.  LABORATORY AND RADIOLOGICAL DATA:  Troponin negative. White blood cell count 12.6, hemoglobin and hematocrit 11.3 and 36.6, platelet count of 215,000, glucose 93, BUN 16, creatinine 0.78, sodium 140, potassium 3.6, chloride 101, CO2 of 8.8.  Liver function tests normal range. Albumin low at 3.1.   Chest x-ray of left lung mass measuring 4.9 cm, emphysematous changes, fluid with thickened pleura, right costophrenic angle is unchanged.  CT scan of the chest showed a 5.1 cm left posterior hilar mass suspicious for primary bronchogenic carcinoma. Mediastinal adenopathy, moderate right pleural effusion.  EKG rapid atrial fibrillation, nonspecific ST-T wave changes.   ASSESSMENT AND PLAN:  1.  Rapid atrial fibrillation. Heart rate still going 150 despite intravenous pushes of diltiazem, will start a low-dose diltiazem drip, and admit to the Critical Care Unit.  2.  Acute respiratory failure. Since the patient has chronic obstructive pulmonary disease, switch the patient to the lowest amount of oxygen possible, hopefully 2 liters, get rid of the nonrebreather.  3.  Lung mass pleural effusion. CAT scan may show an infection.  We will get blood cultures x 2.  Antibiotics Invanz and doxycycline, will be started after thoracentesis.  Hopefully can get a diagnosis from the pleural effusion; if not, may need a bronchoscopy.  We will get pulmonary involved.  Respiratory status and atrial fibrillation need to be controlled prior to any further interventions.  4.  Hypothyroidism on levothyroxine.  5.  Depression on Paxil.  6.  Gastroesophageal reflux disease, not on any medications for this.   TIME SPENT ON ADMISSION: 60 minutes. The patient will be admitted to the Critical Care Unit.  Overall prognosis is poor in a 81 year old woman with a lung mass.   CODE STATUS:  The patient is a FULL CODE at this point in time, but she will discuss this with her daughter.    ____________________________ Tana Conch. Leslye Peer, MD rjw:DT D: 11/05/2013 08:21:27  ET T: 11/05/2013 08:47:31 ET JOB#: [id number]  cc: Tana Conch. Leslye Peer, MD, <Dictator> Tracie Harrier, MD Marisue Brooklyn MD ELECTRONICALLY SIGNED 11/13/2013 15:09

## 2014-07-03 NOTE — Discharge Summary (Signed)
PATIENT NAME:  Savannah, Mcintyre MR#:  [id number] DATE OF BIRTH:  10-Nov-1920  DATE OF ADMISSION:  11/13/2013 DATE OF DISCHARGE:    DIAGNOSES AT TIME OF DISCHARGE:  1. Left lung mass with pleural effusion suspicious for bronchogenic carcinoma.  2. Atrial fibrillation with rapid ventricular rate.  3. Acute on chronic respiratory failure and chronic obstructive pulmonary disease.  4. Hypothyroidism.  5. Depression.   CHIEF COMPLAINT: Shortness of breath.   HISTORY OF PRESENT ILLNESS: Savannah Mcintyre is a 81 year old female with a history of end-stage COPD on chronic oxygen therapy, history of hypothyroidism, depression, GERD, presents to the ED complaining of difficulty breathing, wheezing, coughing, and complaining of right-sided chest pain. The patient was noted to be acute respiratory failure in the ED and placed on 100%  non-rebreather oxygen, and was also noted to be in atrial fibrillation for which she received intravenous diltiazem. The patient's chest x-ray showed evidence of left lung mass and right pleural effusion.   PAST MEDICAL HISTORY: Significant for right radical mastectomy, history of appendectomy, tonsillectomy, adenoidectomy, and right knee surgery. Please see H and P for other details.   HOSPITAL COURSE: The patient was admitted initially to the CCU and received IV Cardizem. She was also seen by oncologist, Dr. Grayland Ormond.  After extensive discussion the patient elected not to pursue the lung mass and elected not to have a biopsy and also not receive chemoradiation therapy. The patient was initially placed on IV antibiotics. She was also seen by palliative care and hospice services were offered. She remained on oxygen at 2 liters nasal cannula. The patient gradually made progress and she reverted to sinus rhythm. Left thoracentesis was also done to help her breathe better. Her elevated troponin was felt due to demand ischemia. Discussions were held with the patient and family and also  with hospice and the patient elected to have home health nursing. She was discharged in stable condition on the following medications.  DISCHARGE MEDICATIONS:  Metoprolol succinate 25 mg once a day, Paxil 10 mg once a day, furosemide 20 mg as needed, fluticasone nasal spray 1 puff b.i.d., Combivent Respimat 1 puff every 6 hours as needed, levothyroxine 88 mcg a day, oxygen at 2 liters nasal cannula.    Home with hospice was also arranged. The patient was stable at the time of discharge.   TOTAL TIME SPENT DISCHARGING THE PATIENT: 35 minutes.    ____________________________ Tracie Harrier, MD vh:bu D: 11/19/2013 18:40:04 ET T: 11/19/2013 20:07:41 ET JOB#: [id number]  cc: Tracie Harrier, MD, <Dictator>

## 2014-07-03 NOTE — Discharge Summary (Signed)
PATIENT NAME:  Savannah Mcintyre, Savannah Mcintyre MR#:  [id number] DATE OF BIRTH:  [birth year]  DATE OF ADMISSION:  11/05/2013 DATE OF DISCHARGE:  11/09/2013  DIAGNOSES AT TIME OF DISCHARGE:  1. Left lung mass with pleural effusion suspicious for bronchogenic carcinoma.  2. Atrial fibrillation with rapid ventricular rate.  3. Acute on chronic respiratory failure and chronic obstructive pulmonary disease.  4. Hypothyroidism.  5. Depression.   CHIEF COMPLAINT: Shortness of breath.   HISTORY OF PRESENT ILLNESS: Leila Schuff is a 81 year old female with a history of end-stage COPD on chronic oxygen therapy, history of hypothyroidism, depression, GERD, presents to the ED complaining of difficulty breathing, wheezing, coughing, and complaining of right-sided chest pain. The patient was noted to be acute respiratory failure in the ED and placed on 100%  non-rebreather oxygen, and was also noted to be in atrial fibrillation for which she received intravenous diltiazem. The patient's chest x-ray showed evidence of left lung mass and right pleural effusion.   PAST MEDICAL HISTORY: Significant for right radical mastectomy, history of appendectomy, tonsillectomy, adenoidectomy, and right knee surgery. Please see H and P for other details.   HOSPITAL COURSE: The patient was admitted initially to the CCU and received IV Cardizem. She was also seen by oncologist, Dr. Grayland Ormond.  After extensive discussion the patient elected not to pursue the lung mass and elected not to have a biopsy and also not receive chemoradiation therapy. The patient was initially placed on IV antibiotics. She was also seen by palliative care and hospice services were offered. She remained on oxygen at 2 liters nasal cannula. The patient gradually made progress and she reverted to sinus rhythm. Left thoracentesis was also done to help her breathe better. Her elevated troponin was felt due to demand ischemia. Discussions were held with the patient and family  and also with hospice and the patient elected to have home health nursing. She was discharged in stable condition on the following medications.  DISCHARGE MEDICATIONS:  Metoprolol succinate 25 mg once a day, Paxil 10 mg once a day, furosemide 20 mg as needed, fluticasone nasal spray 1 puff b.i.d., Combivent Respimat 1 puff every 6 hours as needed, levothyroxine 88 mcg a day, oxygen at 2 liters nasal cannula.    Home with hospice was also arranged. The patient was stable at the time of discharge.   TOTAL TIME SPENT DISCHARGING THE PATIENT: 35 minutes.    ____________________________ Tracie Harrier, MD vh:bu D: 11/19/2013 18:40:00 ET T: 11/19/2013 20:07:41 ET JOB#: [id number]  cc: Tracie Harrier, MD, <Dictator> Tracie Harrier MD ELECTRONICALLY SIGNED 12/01/2013 17:54

## 2014-07-03 NOTE — Consult Note (Signed)
   Comments   Josh Borders, NP, and I met with pt and her daughter. Pt confirms that she wants neither bx nor tx for her presumed malignancy. Pt plans to return to her home at d/c. Understands and accepts that she will need 24hr sitters. We also discussed home hospice and pt and daughter are in agreement. CM aware.   Electronic Signatures: Kofi Murrell, Izora Gala (MD)  (Signed 28-Aug-15 17:04)  Authored: Palliative Care   Last Updated: 28-Aug-15 17:04 by Shiquan Mathieu, Izora Gala (MD)

## 2014-07-03 NOTE — Consult Note (Signed)
History of Present Illness:  Reason for Consult Left hilar mass.   HPI   Patient is a 81 year old female with multiple medical problems on chronic oxygen who presented to the emergency room with acute respiratory failure requiring 100% nonrebreather. She was also noted to be in rapid A. fib. Subsequent chest x-ray revealed a left hilar mass which was confirmed by CT scan. Currently, she is improved but not back to baseline. Her daughter reports multiple falls over the past several months. She has no neurologic complaints. She denies any fevers. She has no chest pain or hemoptysis. She denies any nausea, vomiting, constipation, or diarrhea. She denies any weight loss. She has no urinary complaints. Patient otherwise feels well and offers no further specific complaints.  PFSH:  Additional Past Medical and Surgical History end-stage COPD, hypothyroidism, depression, GERD, osteoporosis, right mastectomy, appendectomy, right knee surgery.  Social history:  Positive tobacco, quit greater than 20 years ago. Distant alcohol use.  Family history: Negative and noncontributory. Husband deceased of lung cancer approximately 5 years ago.   Review of Systems:  Performance Status (ECOG) 2   Review of Systems   As per HPI. Otherwise, 10 point system review was negative.   NURSING NOTES: **Vital Signs.:   28-Aug-15 07:00   Vital Signs Type: Routine   Temperature Temperature (F): 99.2   Celsius: 37.3   Temperature Source: axillary   Pulse Pulse: 93   Pulse source if not from Vital Sign Device: per cardiac monitor   Respirations Respirations: 26   Systolic BP Systolic BP: 151   Diastolic BP (mmHg) Diastolic BP (mmHg): 74   Mean BP: 97   BP Source  if not from Vital Sign Device: non-invasive   Pulse Ox % Pulse Ox %: 95   Pulse Ox Activity Level: At rest   Oxygen Delivery: 2L; Nasal Cannula   Pulse Ox Heart Rate: 93   Physical Exam:  Physical Exam General: Well-developed,  well-nourished, no acute distress. Eyes: Pink conjunctiva, anicteric sclera. HEENT: Normocephalic, moist mucous membranes, clear oropharnyx. Lungs: Clear to auscultation bilaterally. Heart: Regular rate and rhythm. No rubs, murmurs, or gallops. Abdomen: Soft, nontender, nondistended. No organomegaly noted, normoactive bowel sounds. Musculoskeletal: No edema, cyanosis, or clubbing. Neuro: Alert, answering all questions appropriately. Cranial nerves grossly intact. Skin: No rashes or petechiae noted. Psych: Normal affect. Lymphatics: No cervical, calvicular, axillary or inguinal LAD.    PCN: N/V, Hives  Darvon: Unknown  Codeine: Unknown  Sulfa drugs: Unknown  Albuterol: Unknown  ciprofloxacin: Unknown  Cortisone: Unknown  Flagyl: Unknown  Novocain: Unknown    levothyroxine 88 mcg (0.088 mg) oral tablet: 1 tab(s) orally once a day, Status: Active, Quantity: 0, Refills: None   Combivent Respimat CFC free 100 mcg-20 mcg/inh inhalation aerosol: 1 puff(s) inhaled every 6 hours, As Needed - for Wheezing , Status: Active, Quantity: 0, Refills: None   fluticasone CFC free 110 mcg/inh inhalation aerosol: 1 puff(s) inhaled 2 times a day, Status: Active, Quantity: 0, Refills: None   furosemide 20 mg oral tablet: 1 tab(s) orally once a day, As Needed for edema, Status: Active, Quantity: 0, Refills: None   PARoxetine 10 mg oral tablet: 1 tab(s) orally once a day (at bedtime), Status: Active, Quantity: 0, Refills: None  Laboratory Results:  Routine Chem:  28-Aug-15 03:56   Glucose, Serum 86  BUN 16  Creatinine (comp) 0.67  Sodium, Serum 143  Potassium, Serum 3.6  Chloride, Serum 107  CO2, Serum 29  Calcium (Total), Serum 8.5  Anion  Gap 7  Osmolality (calc) 285  eGFR (African American) >60  eGFR (Non-African American) >60 (eGFR values <52m/min/1.73 m2 may be an indication of chronic kidney disease (CKD). Calculated eGFR is useful in patients with stable renal function. The eGFR  calculation will not be reliable in acutely ill patients when serum creatinine is changing rapidly. It is not useful in  patients on dialysis. The eGFR calculation may not be applicable to patients at the low and high extremes of body sizes, pregnant women, and vegetarians.)  Routine Hem:  28-Aug-15 03:56   WBC (CBC) 8.9  RBC (CBC)  3.44  Hemoglobin (CBC)  10.6  Hematocrit (CBC)  32.2  Platelet Count (CBC) 199  MCV 94  MCH 30.9  MCHC 33.0  RDW 14.1  Neutrophil % 77.2  Lymphocyte % 7.6  Monocyte % 12.6  Eosinophil % 2.4  Basophil % 0.2  Neutrophil #  6.9  Lymphocyte #  0.7  Monocyte #  1.1  Eosinophil # 0.2  Basophil # 0.0 (Result(s) reported on 06 Nov 2013 at 04:32AM.)   Radiology Results: CT:    27-Aug-15 07:28, CT Chest With Contrast  CT Chest With Contrast   REASON FOR EXAM:    left hilum opacity on plain film, dyspnea cough  COMMENTS:       PROCEDURE: CT  - CT CHEST WITH CONTRAST  - Nov 05 2013  7:28AM     CLINICAL DATA:  Coughing. Left hilar mass like opacity on current  chest radiograph.    EXAM:  CT CHEST WITH CONTRAST    TECHNIQUE:  Multidetector CT imaging of the chest was performed during  intravenous contrast administration.  CONTRAST:  75 mL of Isovue 370 intravenous contrast    COMPARISON:  Current chest radiograph.  CT, 09/11/2006.    FINDINGS:  There is a left hilar mass, lying along the posterior inferior  aspect of the left hilum narrowing the left lower lobe bronchus. It  measures 5.1 cm x 3.2 cm x 4.2 cm in size. It is in continuity with  the left main pulmonary artery and the descending thoracic aorta  without convincing vascular invasion. There is mild mediastinal  adenopathy. A precarinal lymph node measures 14 mm short axis. A  subcarinal node measures 16 mm in short axis. There is no right  hilar mass or adenopathy.    Moderate size right pleural effusion. There is coarse reticular and  dependent opacity in the right lower lobe that  is likely  atelectasis. Additional patchy airspace and reticular opacity is  noted at the base of the right middle lobe and lingula of the left  upper lobe and posterior inferior left lower lobe, all most likely  atelectasis. Lungs show changes of moderate centrilobular emphysema  with areas of reticular scarring and mild interstitial thickening.  No lung mass or discrete nodule is seen.    No neck base or axillary masses or enlarged lymph nodes.    Limited evaluation of the upper abdomen shows no liver or adrenal  masses. Small low-density lesion is noted in the posterior right  kidney likely a cyst. There is a calcification in the midpole of the  left kidney. No enlarged lymph nodes are seen.  No osteoblastic or osteolytic lesions.    Left hilar mass is new from the prior CT as is the mediastinal  adenopathy. Changes of emphysema has mildly progressed. Right  pleural effusionwas present previously and is similar in size.  Atelectasis has increased.  IMPRESSION:  1. 5.1 cm left posterior hilar mass highly suspicious for a primary  bronchogenic carcinoma. There is mild associated mediastinal  adenopathy.  2. Moderate right pleural effusion is similar to the prior study and  therefore most likely chronic. No discrete lung masses or nodules  are seen to suggest metastatic disease. There is no evidence of  metastatic disease to the visualized upper abdomen and there isno  CT evidence of bony metastatic disease.      Electronically Signed    By: Lajean Manes M.D.    On: 11/05/2013 07:45         Verified By: Lasandra Beech, M.D.,   Assessment and Plan: Impression:   Left hilar mass, highly suspicious for underlying malignancy. Plan:   1. Left hilar mass: Highly suspicious for underlying lung cancer. Patient also noted to have a pleural effusion which is possibly malignant. She is having a thoracentesis later today. After lengthy discussion with patient and her daughter,  she has decided that she did not wish to pursue any sort of treatment including surgery, chemotherapy, or radiation therapy. Because of this, she has stated she will likely refuse biopsy. No further intervention is needed. have placed a palliative care consult to further discuss symptom management and hospice. No followup is necessary. 45 minutes was spent in discussion and consultation. consult, call with questions.  Electronic Signatures: Delight Hoh (MD)  (Signed 28-Aug-15 17:54)  Authored: HISTORY OF PRESENT ILLNESS, PFSH, ROS, NURSING NOTES, PE, ALLERGIES, HOME MEDICATIONS, LABS, OTHER RESULTS, ASSESSMENT AND PLAN   Last Updated: 28-Aug-15 17:54 by Delight Hoh (MD)

## 2014-09-06 ENCOUNTER — Emergency Department
Admission: EM | Admit: 2014-09-06 | Discharge: 2014-09-07 | Disposition: A | Discharge status: Home / Self Care | Payer: Medicare Other | Attending: Emergency Medicine | Admitting: Emergency Medicine

## 2014-09-06 ENCOUNTER — Encounter: Payer: Self-pay | Admitting: Emergency Medicine

## 2014-09-06 DIAGNOSIS — N3289 Other specified disorders of bladder: Secondary | ICD-10-CM | POA: Insufficient documentation

## 2014-09-06 DIAGNOSIS — R110 Nausea: Secondary | ICD-10-CM | POA: Insufficient documentation

## 2014-09-06 DIAGNOSIS — R109 Unspecified abdominal pain: Secondary | ICD-10-CM | POA: Diagnosis present

## 2014-09-06 DIAGNOSIS — R1084 Generalized abdominal pain: Secondary | ICD-10-CM | POA: Diagnosis not present

## 2014-09-06 HISTORY — DX: Chronic obstructive pulmonary disease, unspecified: J44.9

## 2014-09-06 HISTORY — DX: Unspecified asthma, uncomplicated: J45.909

## 2014-09-06 HISTORY — DX: Dependence on supplemental oxygen: Z99.81

## 2014-09-06 HISTORY — DX: Malignant neoplasm of unspecified part of unspecified bronchus or lung: C34.90

## 2014-09-06 HISTORY — DX: Malignant neoplasm of unspecified site of unspecified female breast: C50.919

## 2014-09-06 LAB — CBC
HEMATOCRIT: 35.4 % (ref 35.0–47.0)
HEMOGLOBIN: 11.5 g/dL — AB (ref 12.0–16.0)
MCH: 29.9 pg (ref 26.0–34.0)
MCHC: 32.3 g/dL (ref 32.0–36.0)
MCV: 92.4 fL (ref 80.0–100.0)
Platelets: 211 10*3/uL (ref 150–440)
RBC: 3.83 MIL/uL (ref 3.80–5.20)
RDW: 14.2 % (ref 11.5–14.5)
WBC: 8.1 10*3/uL (ref 3.6–11.0)

## 2014-09-06 MED ORDER — MORPHINE SULFATE 4 MG/ML IJ SOLN
INTRAMUSCULAR | Status: AC
  Administered 2014-09-06: 4 mg via INTRAVENOUS
  Filled 2014-09-06: qty 1

## 2014-09-06 MED ORDER — ONDANSETRON HCL 4 MG/2ML IJ SOLN
INTRAMUSCULAR | Status: AC
  Administered 2014-09-06: 4 mg via INTRAVENOUS
  Filled 2014-09-06: qty 2

## 2014-09-06 MED ORDER — IOHEXOL 240 MG/ML SOLN
25.0000 mL | Freq: Once | INTRAMUSCULAR | Status: AC | PRN
  Administered 2014-09-06: 25 mL via ORAL

## 2014-09-06 MED ORDER — SODIUM CHLORIDE 0.9 % IV BOLUS (SEPSIS)
500.0000 mL | Freq: Once | INTRAVENOUS | Status: AC
  Administered 2014-09-07: 500 mL via INTRAVENOUS

## 2014-09-06 MED ORDER — MORPHINE SULFATE 4 MG/ML IJ SOLN
4.0000 mg | Freq: Once | INTRAMUSCULAR | Status: AC
  Administered 2014-09-06: 4 mg via INTRAVENOUS

## 2014-09-06 MED ORDER — ONDANSETRON HCL 4 MG/2ML IJ SOLN
4.0000 mg | Freq: Once | INTRAMUSCULAR | Status: AC
  Administered 2014-09-06: 4 mg via INTRAVENOUS

## 2014-09-06 NOTE — ED Notes (Signed)
Patient to ED via EMS from home alone.  States this evening at 1830, started having abdominal pain.  Took something to help with gas and passed gas, but the pain persisted.  She has been having regular BMs and no diarrhea.  Pain is 10/10. Continues to belch.

## 2014-09-06 NOTE — ED Provider Notes (Signed)
[facility] Emergency Department Provider Note  ____________________________________________  Time seen: Approximately 11:16 PM  I have reviewed the triage vital signs and the nursing notes.   HISTORY  Chief Complaint Abdominal Pain    HPI Savannah Mcintyre is a 81 y.o. female who comes in with sharp abdominal pain that started today at approximately 1830. The patient reports it started as a gas pain and continued as a stomachache ever since. The patient denies any vomiting or diarrhea. She reports that she passed gas and then had a small several bowel movements with no improvement in the pain. The patient denies any pain with urination. She did not take anything for the pain at home because she was unsure what was going on. The patient has never had any pain like this before. Currently the pain is a 6 out of 10 in intensity but the patient reports that it does go up to a 10 at times. She does have some mild nausea as well with the pain. The patient has a history of lung cancer that she is not undergoing treatment for at this time. She reports that she was able to eat dinner without any difficulty this evening prior to the beginning of the pain. The patient wears oxygen at home for her COPD. The patient came in because she was unable to tolerate the pain at home.   Past medical history COPD Hypothyroidism Breast cancer Lung cancer  There are no active problems to display for this patient.   Past surgical history . Appendectomy Right mastectomy  No current outpatient prescriptions on file.  Allergies The patient reports that she is allergic to most antibiotics  No family history on file.  Social History History  Substance Use Topics  . Smoking status: Not on file  . Smokeless tobacco: Not on file  . Alcohol Use: Not on file    Review of Systems Constitutional: No fever/chills Eyes: Blurred vision ENT: No sore throat. Cardiovascular: Denies chest  pain. Respiratory: Denies shortness of breath. Gastrointestinal: Abdominal pain and nausea Genitourinary: Negative for dysuria. Musculoskeletal: Negative for back pain. Skin: Negative for rash. Neurological: Negative for headaches, focal weakness or numbness.  10-point ROS otherwise negative.  ____________________________________________   PHYSICAL EXAM:  VITAL SIGNS: ED Triage Vitals  Enc Vitals Group     BP --      Pulse --      Resp --      Temp 09/06/14 2257 98.1 F (36.7 C)     Temp Source 09/06/14 2257 Oral     SpO2 --      Weight --      Height --      Head Cir --      Peak Flow --      Pain Score --      Pain Loc --      Pain Edu? --      Excl. in Start? --     Constitutional: Alert and oriented. Well appearing and in moderate distress. Eyes: Conjunctivae are normal. PERRL. EOMI. Head: Atraumatic. Nose: No congestion/rhinnorhea. Mouth/Throat: Mucous membranes are moist.  Oropharynx non-erythematous. Cardiovascular: Normal rate, regular rhythm. Grossly normal heart sounds.  Good peripheral circulation. Respiratory: Normal respiratory effort.  No retractions. Lungs CTAB. Gastrointestinal: Soft and tender in the right upper and right lower quadrant. No distention. Mildly decreased bowel sounds throughout Genitourinary: Deferred Musculoskeletal: No lower extremity tenderness nor edema.   Neurologic:  Normal speech and language. No gross focal neurologic  deficits are appreciated.  Skin:  Skin is warm, dry and intact. No rash noted. Psychiatric: Mood and affect are normal.   ____________________________________________   LABS (all labs ordered are listed, but only abnormal results are displayed)  Labs Reviewed  CBC - Abnormal; Notable for the following:    Hemoglobin 11.5 (*)    All other components within normal limits  COMPREHENSIVE METABOLIC PANEL - Abnormal; Notable for the following:    Glucose, Bld 104 (*)    All other components within normal limits   URINALYSIS COMPLETEWITH MICROSCOPIC ([facility] ONLY) - Abnormal; Notable for the following:    Color, Urine STRAW (*)    APPearance CLEAR (*)    Ketones, ur TRACE (*)    Leukocytes, UA TRACE (*)    Bacteria, UA RARE (*)    Squamous Epithelial / LPF 0-5 (*)    All other components within normal limits  TROPONIN I  LACTIC ACID, PLASMA   ____________________________________________  EKG  ED ECG REPORT I, Loney Hering, the attending physician, personally viewed and interpreted this ECG.   Date: 09/06/2014  EKG Time: 2303  Rate: 82  Rhythm: normal EKG, normal sinus rhythm  Axis: Normal  Intervals:none  ST&T Change: None  ____________________________________________  RADIOLOGY  CT abdomen and pelvis: Small right pleural effusion, probable 12 mm urinary bladder neoplasm, left posterior lateral, mildly prominent bile duct and pancreatic duct without evidence of mass or obstructing calculus, diverticulosis, fat-containing umbilical hernia, nephrolithiasis  ____________________________________________   PROCEDURES  Procedure(s) performed: None  Critical Care performed: No  ____________________________________________   INITIAL IMPRESSION / ASSESSMENT AND PLAN / ED COURSE  Pertinent labs & imaging results that were available during my care of the patient were reviewed by me and considered in my medical decision making (see chart for details).  This is a 81 year old female who comes in today with abdominal pain. The patient is having some significant pain so I will give her 4 mg of morphine as well as 4 mg of Zofran. I will give the patient a small amount of normal saline and await the results of her blood work as well as her CT scan to determine the possible cause of her pain.  ----------------------------------------- 5:19 AM on 09/07/2014 -----------------------------------------  The patient received 2 doses of morphine and her pain is improved. She does still have  some nausea but her CT scan is unremarkable for acute cause of her abdominal pain. The patient's blood work is also unremarkable. I will discharge the patient to have her follow-up with her primary care physician for further evaluation of her abdominal pain.  ____________________________________________   FINAL CLINICAL IMPRESSION(S) / ED DIAGNOSES  Final diagnoses:  Generalized abdominal pain  Nausea  Bladder mass      Loney Hering, MD 09/07/14 [phone]

## 2014-09-07 ENCOUNTER — Emergency Department: Payer: Medicare Other

## 2014-09-07 DIAGNOSIS — N3289 Other specified disorders of bladder: Secondary | ICD-10-CM | POA: Diagnosis not present

## 2014-09-07 LAB — LACTIC ACID, PLASMA: LACTIC ACID, VENOUS: 0.7 mmol/L (ref 0.5–2.0)

## 2014-09-07 LAB — URINALYSIS COMPLETE WITH MICROSCOPIC (ARMC ONLY)
Bilirubin Urine: NEGATIVE
GLUCOSE, UA: NEGATIVE mg/dL
Hgb urine dipstick: NEGATIVE
NITRITE: NEGATIVE
Protein, ur: NEGATIVE mg/dL
Specific Gravity, Urine: 1.013 (ref 1.005–1.030)
pH: 8 (ref 5.0–8.0)

## 2014-09-07 LAB — COMPREHENSIVE METABOLIC PANEL
ALBUMIN: 4 g/dL (ref 3.5–5.0)
ALK PHOS: 90 U/L (ref 38–126)
ALT: 19 U/L (ref 14–54)
AST: 25 U/L (ref 15–41)
Anion gap: 8 (ref 5–15)
BUN: 16 mg/dL (ref 6–20)
CALCIUM: 9.2 mg/dL (ref 8.9–10.3)
CO2: 31 mmol/L (ref 22–32)
Chloride: 101 mmol/L (ref 101–111)
Creatinine, Ser: 0.62 mg/dL (ref 0.44–1.00)
GFR calc non Af Amer: >60 mL/min (ref >60)
Glucose, Bld: 104 mg/dL — ABNORMAL HIGH (ref 65–99)
Potassium: 3.9 mmol/L (ref 3.5–5.1)
Sodium: 140 mmol/L (ref 135–145)
TOTAL PROTEIN: 7.5 g/dL (ref 6.5–8.1)
Total Bilirubin: 0.5 mg/dL (ref 0.3–1.2)

## 2014-09-07 LAB — TROPONIN I

## 2014-09-07 MED ORDER — ONDANSETRON HCL 4 MG/2ML IJ SOLN
INTRAMUSCULAR | Status: AC
  Administered 2014-09-07: 4 mg via INTRAVENOUS
  Filled 2014-09-07: qty 2

## 2014-09-07 MED ORDER — ONDANSETRON 4 MG PO TBDP: 4.0000 mg | ORAL_TABLET | Freq: Three times a day (TID) | ORAL | Status: AC | PRN

## 2014-09-07 MED ORDER — MORPHINE SULFATE 4 MG/ML IJ SOLN
INTRAMUSCULAR | Status: AC
  Administered 2014-09-07: 4 mg via INTRAVENOUS
  Filled 2014-09-07: qty 1

## 2014-09-07 MED ORDER — ONDANSETRON HCL 4 MG/2ML IJ SOLN
4.0000 mg | Freq: Once | INTRAMUSCULAR | Status: AC
  Administered 2014-09-07: 4 mg via INTRAVENOUS

## 2014-09-07 MED ORDER — MORPHINE SULFATE 4 MG/ML IJ SOLN
4.0000 mg | Freq: Once | INTRAMUSCULAR | Status: AC
  Administered 2014-09-07: 4 mg via INTRAVENOUS

## 2014-09-07 MED ORDER — IOHEXOL 300 MG/ML  SOLN
100.0000 mL | Freq: Once | INTRAMUSCULAR | Status: AC | PRN
  Administered 2014-09-07: 100 mL via INTRAVENOUS

## 2014-09-07 MED ORDER — ONDANSETRON HCL 4 MG/2ML IJ SOLN
INTRAMUSCULAR | Status: AC
  Filled 2014-09-07: qty 2

## 2014-09-07 NOTE — ED Notes (Signed)
Pt vomited about 259m pt given IV zofran at this time before going home

## 2014-09-07 NOTE — ED Notes (Signed)
Pt transferred back to home via [facility] with oxygen at 2 liters via nasal cannula. Daughter could not transport. vss at time of discharge.

## 2014-09-07 NOTE — Discharge Instructions (Signed)
Abdominal Pain Many things can cause abdominal pain. Usually, abdominal pain is not caused by a disease and will improve without treatment. It can often be observed and treated at home. Your health care provider will do a physical exam and possibly order blood tests and X-rays to help determine the seriousness of your pain. However, in many cases, more time must pass before a clear cause of the pain can be found. Before that point, your health care provider may not know if you need more testing or further treatment. HOME CARE INSTRUCTIONS  Monitor your abdominal pain for any changes. The following actions may help to alleviate any discomfort you are experiencing:  Only take over-the-counter or prescription medicines as directed by your health care provider.  Do not take laxatives unless directed to do so by your health care provider.  Try a clear liquid diet (broth, tea, or water) as directed by your health care provider. Slowly move to a bland diet as tolerated. SEEK MEDICAL CARE IF:  You have unexplained abdominal pain.  You have abdominal pain associated with nausea or diarrhea.  You have pain when you urinate or have a bowel movement.  You experience abdominal pain that wakes you in the night.  You have abdominal pain that is worsened or improved by eating food.  You have abdominal pain that is worsened with eating fatty foods.  You have a fever. SEEK IMMEDIATE MEDICAL CARE IF:   Your pain does not go away within 2 hours.  You keep throwing up (vomiting).  Your pain is felt only in portions of the abdomen, such as the right side or the left lower portion of the abdomen.  You pass bloody or black tarry stools. MAKE SURE YOU:  Understand these instructions.   Will watch your condition.   Will get help right away if you are not doing well or get worse.  Document Released: 12/06/2004 Document Revised: 03/03/2013 Document Reviewed: 11/05/2012 [facility] Patient Information  2015 [place], [place]. This information is not intended to replace advice given to you by your health care provider. Make sure you discuss any questions you have with your health care provider.  Nausea, Adult Nausea is the feeling that you have an upset stomach or have to vomit. Nausea by itself is not likely a serious concern, but it may be an early sign of more serious medical problems. As nausea gets worse, it can lead to vomiting. If vomiting develops, there is the risk of dehydration.  CAUSES   Viral infections.  Food poisoning.  Medicines.  Pregnancy.  Motion sickness.  Migraine headaches.  Emotional distress.  Severe pain from any source.  Alcohol intoxication. HOME CARE INSTRUCTIONS  Get plenty of rest.  Ask your caregiver about specific rehydration instructions.  Eat small amounts of food and sip liquids more often.  Take all medicines as told by your caregiver. SEEK MEDICAL CARE IF:  You have not improved after 2 days, or you get worse.  You have a headache. SEEK IMMEDIATE MEDICAL CARE IF:   You have a fever.  You faint.  You keep vomiting or have blood in your vomit.  You are extremely weak or dehydrated.  You have dark or bloody stools.  You have severe chest or abdominal pain. MAKE SURE YOU:  Understand these instructions.  Will watch your condition.  Will get help right away if you are not doing well or get worse. Document Released: 04/05/2004 Document Revised: 11/21/2011 Document Reviewed: 11/08/2010 [employer] Patient Information 2015  [employer]. This information is not intended to replace advice given to you by your health care provider. Make sure you discuss any questions you have with your health care provider.

## 2014-09-07 NOTE — ED Notes (Signed)
Returned from CT.

## 2014-09-07 NOTE — ED Notes (Signed)
Pt family doens't have oxygen with her so pt will be transported back home by EMS. Pt not nauseate anymore just dizzy. Will continue to monitor.

## 2015-01-22 ENCOUNTER — Emergency Department: Payer: Medicare Other

## 2015-01-22 ENCOUNTER — Encounter: Payer: Self-pay | Admitting: Emergency Medicine

## 2015-01-22 ENCOUNTER — Emergency Department
Admission: EM | Admit: 2015-01-22 | Discharge: 2015-01-22 | Disposition: A | Discharge status: Home / Self Care | Payer: Medicare Other | Attending: Emergency Medicine | Admitting: Emergency Medicine

## 2015-01-22 DIAGNOSIS — C7989 Secondary malignant neoplasm of other specified sites: Secondary | ICD-10-CM | POA: Insufficient documentation

## 2015-01-22 DIAGNOSIS — Z7951 Long term (current) use of inhaled steroids: Secondary | ICD-10-CM | POA: Diagnosis not present

## 2015-01-22 DIAGNOSIS — Z880 Allergy status to penicillin: Secondary | ICD-10-CM | POA: Diagnosis not present

## 2015-01-22 DIAGNOSIS — R4182 Altered mental status, unspecified: Secondary | ICD-10-CM | POA: Diagnosis not present

## 2015-01-22 DIAGNOSIS — R112 Nausea with vomiting, unspecified: Secondary | ICD-10-CM | POA: Insufficient documentation

## 2015-01-22 DIAGNOSIS — Z79899 Other long term (current) drug therapy: Secondary | ICD-10-CM | POA: Insufficient documentation

## 2015-01-22 DIAGNOSIS — R1032 Left lower quadrant pain: Secondary | ICD-10-CM

## 2015-01-22 DIAGNOSIS — C799 Secondary malignant neoplasm of unspecified site: Secondary | ICD-10-CM

## 2015-01-22 LAB — CBC WITH DIFFERENTIAL/PLATELET
BASOS PCT: 1 %
Basophils Absolute: 0.1 10*3/uL (ref 0–0.1)
Eosinophils Absolute: 0.1 10*3/uL (ref 0–0.7)
Eosinophils Relative: 1 %
HEMATOCRIT: 31.9 % — AB (ref 35.0–47.0)
Hemoglobin: 10.5 g/dL — ABNORMAL LOW (ref 12.0–16.0)
Lymphocytes Relative: 11 %
Lymphs Abs: 1 10*3/uL (ref 1.0–3.6)
MCH: 30 pg (ref 26.0–34.0)
MCHC: 32.8 g/dL (ref 32.0–36.0)
MCV: 91.4 fL (ref 80.0–100.0)
MONOS PCT: 12 %
Monocytes Absolute: 1 10*3/uL — ABNORMAL HIGH (ref 0.2–0.9)
NEUTROS ABS: 6.4 10*3/uL (ref 1.4–6.5)
Neutrophils Relative %: 75 %
Platelets: 199 10*3/uL (ref 150–440)
RBC: 3.49 MIL/uL — AB (ref 3.80–5.20)
RDW: 14.1 % (ref 11.5–14.5)
WBC: 8.6 10*3/uL (ref 3.6–11.0)

## 2015-01-22 LAB — URINALYSIS COMPLETE WITH MICROSCOPIC (ARMC ONLY)
BILIRUBIN URINE: NEGATIVE
Glucose, UA: NEGATIVE mg/dL
Hgb urine dipstick: NEGATIVE
KETONES UR: NEGATIVE mg/dL
Nitrite: NEGATIVE
PH: 5 (ref 5.0–8.0)
Protein, ur: NEGATIVE mg/dL
Specific Gravity, Urine: 1.017 (ref 1.005–1.030)

## 2015-01-22 LAB — COMPREHENSIVE METABOLIC PANEL
ALBUMIN: 3.2 g/dL — AB (ref 3.5–5.0)
ALT: 52 U/L (ref 14–54)
AST: 91 U/L — AB (ref 15–41)
Alkaline Phosphatase: 369 U/L — ABNORMAL HIGH (ref 38–126)
Anion gap: 6 (ref 5–15)
BUN: 23 mg/dL — AB (ref 6–20)
CALCIUM: 8.5 mg/dL — AB (ref 8.9–10.3)
CHLORIDE: 101 mmol/L (ref 101–111)
CO2: 31 mmol/L (ref 22–32)
Creatinine, Ser: 0.79 mg/dL (ref 0.44–1.00)
GFR calc non Af Amer: >60 mL/min (ref >60)
GLUCOSE: 93 mg/dL (ref 65–99)
Potassium: 3.6 mmol/L (ref 3.5–5.1)
Sodium: 138 mmol/L (ref 135–145)
Total Bilirubin: 0.7 mg/dL (ref 0.3–1.2)
Total Protein: 6.6 g/dL (ref 6.5–8.1)

## 2015-01-22 LAB — LIPASE, BLOOD: LIPASE: 45 U/L (ref 11–51)

## 2015-01-22 LAB — TROPONIN I

## 2015-01-22 MED ORDER — LORAZEPAM 2 MG/ML IJ SOLN
0.5000 mg | Freq: Once | INTRAMUSCULAR | Status: AC
  Administered 2015-01-22: 0.5 mg via INTRAVENOUS
  Filled 2015-01-22: qty 1

## 2015-01-22 MED ORDER — ONDANSETRON HCL 4 MG/2ML IJ SOLN
4.0000 mg | Freq: Once | INTRAMUSCULAR | Status: AC
  Administered 2015-01-22: 4 mg via INTRAVENOUS

## 2015-01-22 MED ORDER — IOHEXOL 240 MG/ML SOLN
25.0000 mL | Freq: Once | INTRAMUSCULAR | Status: AC | PRN
  Administered 2015-01-22: 25 mL via ORAL

## 2015-01-22 MED ORDER — SODIUM CHLORIDE 0.9 % IV BOLUS (SEPSIS)
500.0000 mL | Freq: Once | INTRAVENOUS | Status: AC
  Administered 2015-01-22: 500 mL via INTRAVENOUS

## 2015-01-22 MED ORDER — IOHEXOL 300 MG/ML  SOLN
100.0000 mL | Freq: Once | INTRAMUSCULAR | Status: AC | PRN
  Administered 2015-01-22: 100 mL via INTRAVENOUS

## 2015-01-22 MED ORDER — ONDANSETRON HCL 4 MG/2ML IJ SOLN
INTRAMUSCULAR | Status: AC
  Filled 2015-01-22: qty 2

## 2015-01-22 NOTE — ED Notes (Signed)
Egg biscuit given to pt and she is sitting up eating at this time. Pt also drinking Milk and Cranberry juice.

## 2015-01-22 NOTE — ED Notes (Signed)
Pt resting in bed with eyes closed. Pt respirations are noted to be somewhat irregular, however O2 sats remain at 99% at this time.

## 2015-01-22 NOTE — ED Notes (Signed)
Patient transported to CT 

## 2015-01-22 NOTE — ED Notes (Signed)
Spoke with Ander Purpura, Director at [employer] and report given at this time. She states pt is going to room 14 and that her daughter is at the house now signing consents. She asked that discharge papers be completed and EMS transport arranged.

## 2015-01-22 NOTE — ED Provider Notes (Signed)
-----------------------------------------   12:29 PM on 01/22/2015 -----------------------------------------  Savannah Mcintyre is a 56 female who presented this past evening due to abdominal pain. She was seen by my colleague Dr. Dineen Kid. Please see his history and physical for additional details.  Patient has a known history of blood cancer, however I don't think it was understood the degree of metastasis that she had. CT shows innumerable large masses through the liver and scattered lucencies through the osseous structures.  Dr. Dineen Kid spoke with the family and discussed the situation with hospice. Hospice services come and evaluated the patient in the emergency department and has agreed that she is appropriate for their care and she will be moved to hospice today.  ~~~~~~~~~~~~~~~~~~ From Dr. Azzie Almas H&P:   LABS (all labs ordered are listed, but only abnormal results are displayed)  Labs Reviewed  CBC WITH DIFFERENTIAL/PLATELET - Abnormal; Notable for the following:    RBC 3.49 (*)    Hemoglobin 10.5 (*)    HCT 31.9 (*)    Monocytes Absolute 1.0 (*)    All other components within normal limits  COMPREHENSIVE METABOLIC PANEL - Abnormal; Notable for the following:    BUN 23 (*)    Calcium 8.5 (*)    Albumin 3.2 (*)    AST 91 (*)    Alkaline Phosphatase 369 (*)    All other components within normal limits  URINALYSIS COMPLETEWITH MICROSCOPIC ([facility] ONLY) - Abnormal; Notable for the following:    Color, Urine YELLOW (*)    APPearance CLEAR (*)    Leukocytes, UA 2+ (*)    Bacteria, UA RARE (*)    Squamous Epithelial / LPF 0-5 (*)    All other components within normal limits  LIPASE, BLOOD  TROPONIN I   ____________________________________________  EKG  ED ECG REPORT I, Doran Stabler, the attending physician, personally viewed and interpreted this ECG.  Date: 01/22/2015 EKG Time: 12:36  AM Rate: 82 Rhythm: normal EKG, normal sinus rhythm Axis: Normal axis Intervals:none ST&T Change: No ST segment elevation or depression. Single T-wave inversion in aVL which is unchanged from 09/06/2014. ____________________________________________  RADIOLOGY  Chest x-ray with emphysema and chronic small right pleural effusion. CT head without any acute intracranial pathology. CAT scan of the abdomen with new and innumerable large masses throughout the liver. Scattered vague lucencies throughout the visualized osseous structures are nonspecific but could reflect metastatic disease. Mildly loculated small right pleural effusion again noted. 1.2 cm focus of soft tissue density in the left side of the bladder may reflect malignancy. ____________________________________________  INITIAL IMPRESSION / ASSESSMENT AND PLAN / ED COURSE  Pertinent labs & imaging results that were available during my care of the patient were reviewed by me and considered in my medical decision making (see chart for details).  ----------------------------------------- 5:12 AM on 01/22/2015 -----------------------------------------  Patient family now requesting hospice evaluation. I think this is reasonable given the patient's advanced disease state. Discussed with hospice worker Jeannine Kitten, who says that a hospice representative will come to the emergency department to evaluate the patient. Awaiting their evaluation at this time. Patient and family are aware of the plan.  ----------------------------------------- 7:26 AM on 01/22/2015 -----------------------------------------  At this time we are still pending disposition per hospice. Signed out to Dr. Thomasene Lot. ____________________________________________   FINAL CLINICAL IMPRESSION(S) / ED DIAGNOSES  Left lower quadrant pain secondary to Metastatic cancer.      ~~~~~~~~~~~~~~~~~~~~~~~~~~~~~~~~~~~~~~~~~~~     Ahmed Prima, MD 01/22/15 [phone]

## 2015-01-22 NOTE — ED Notes (Signed)
Report given to Stacey, RN.

## 2015-01-22 NOTE — ED Provider Notes (Addendum)
[facility] Emergency Department Provider Note  ____________________________________________  Time seen: Seen upon arrival to the emergency department  I have reviewed the triage vital signs and the nursing notes.   HISTORY  Chief Complaint Nausea and Abdominal Pain    HPI Savannah Mcintyre is a 81 y.o. female with a history of lung cancer who is presenting today with abdominal pain to the left lower quadrant with vomiting. The patient says that the pain is been going on for the past 3 days. She says that it is cramping and constant. She denies any radiation but she does say that she has chronic and unchanged diffuse back pain. She says that she is also been hallucinating and hearing music such as [place] when she knows it is not playing. She denies any dysuria. Says that she is able to move her bowels and passed gas. Says that she has had several episodes of vomiting since yesterday without any bloody or green/bilious vomitus. Denies any headache at this time.Also with a history of lung cancer which the patient says has "spread." She does not take any chemotherapy or radiation because of her age. She also says that the cancer has been deemed nonoperable.   Past Medical History  Diagnosis Date  . COPD (chronic obstructive pulmonary disease) ([place])   . Supplemental oxygen dependent     3 L [place]  . Asthma   . Lung cancer ([place])   . Breast cancer ([place])     There are no active problems to display for this patient.   Past Surgical History  Procedure Laterality Date  . Appendectomy    . Breast surgery      Current Outpatient Rx  Name  Route  Sig  Dispense  Refill  . albuterol (PROAIR HFA) 108 (90 BASE) MCG/ACT inhaler   Inhalation   Inhale 2 puffs into the lungs every 6 (six) hours as needed.         . Aspirin-Acetaminophen-Caffeine (EXCEDRIN PO)   Oral   Take 1 tablet by mouth daily as needed.         Marland Kitchen b complex vitamins capsule   Oral   Take  1 capsule by mouth daily.         . budesonide-formoterol (SYMBICORT) 160-4.5 MCG/ACT inhaler   Inhalation   Inhale 2 puffs into the lungs 2 (two) times daily.         Marland Kitchen levothyroxine (SYNTHROID, LEVOTHROID) 88 MCG tablet   Oral   Take 1 tablet by mouth daily.         . montelukast (SINGULAIR) 10 MG tablet   Oral   Take 1 tablet by mouth daily.         Marland Kitchen PARoxetine (PAXIL) 10 MG tablet   Oral   Take 1 tablet by mouth daily.         . Vitamin D, Ergocalciferol, (DRISDOL) 50000 UNITS CAPS capsule   Oral   Take 1 capsule by mouth once a week.         . ondansetron (ZOFRAN ODT) 4 MG disintegrating tablet   Oral   Take 1 tablet (4 mg total) by mouth every 8 (eight) hours as needed for nausea or vomiting. Patient not taking: Reported on 01/22/2015   12 tablet   0     Allergies Albuterol; Ciprofloxacin; Codeine; Cortisone; Flagyl; Penicillins; Procaine; and Sulfa antibiotics  No family history on file.  Social History Social History  Substance Use Topics  . Smoking  status: Unknown If Ever Smoked  . Smokeless tobacco: None  . Alcohol Use: No    Review of Systems Constitutional: No fever/chills Eyes: No visual changes. ENT: No sore throat. Cardiovascular: Denies chest pain. Respiratory: Denies shortness of breath. Gastrointestinal:  No diarrhea.  No constipation. Genitourinary: Negative for dysuria. Musculoskeletal: Negative for back pain. Skin: Negative for rash. Neurological: Negative for headaches, focal weakness or numbness.  10-point ROS otherwise negative.  ____________________________________________   PHYSICAL EXAM:  VITAL SIGNS: ED Triage Vitals  Enc Vitals Group     BP 01/22/15 0036 158/80 mmHg     Pulse Rate 01/22/15 0034 82     Resp 01/22/15 0034 26     Temp 01/22/15 0034 98.3 F (36.8 C)     Temp src --      SpO2 01/22/15 0034 100 %     Weight 01/22/15 0034 135 lb (61.236 kg)     Height 01/22/15 0034 '5\' 2"'$  (1.575 m)     Head  Cir --      Peak Flow --      Pain Score 01/22/15 0028 9     Pain Loc --      Pain Edu? --      Excl. in [place]? --     Constitutional: Alert and oriented. Well appearing and in no acute distress. Eyes: Conjunctivae are normal. PERRL. EOMI. Head: Atraumatic. Nose: No congestion/rhinnorhea. Wearing nasal cannula at 3 L which is her baseline. Mouth/Throat: Mucous membranes are moist.  Oropharynx non-erythematous. Neck: No stridor.   Cardiovascular: Normal rate, regular rhythm. Grossly normal heart sounds.  Good peripheral circulation. Respiratory: Normal respiratory effort.  No retractions. Lungs CTAB. Gastrointestinal: Soft with mild left lower quadrant tenderness to palpation. No distention. No abdominal bruits. No CVA tenderness. Musculoskeletal: No lower extremity tenderness nor edema.  No joint effusions. Neurologic:  Normal speech and language. No gross focal neurologic deficits are appreciated. No gait instability. Skin:  Skin is warm, dry and intact. No rash noted. Psychiatric: Mood and affect are normal. Speech and behavior are normal.  ____________________________________________   LABS (all labs ordered are listed, but only abnormal results are displayed)  Labs Reviewed  CBC WITH DIFFERENTIAL/PLATELET - Abnormal; Notable for the following:    RBC 3.49 (*)    Hemoglobin 10.5 (*)    HCT 31.9 (*)    Monocytes Absolute 1.0 (*)    All other components within normal limits  COMPREHENSIVE METABOLIC PANEL - Abnormal; Notable for the following:    BUN 23 (*)    Calcium 8.5 (*)    Albumin 3.2 (*)    AST 91 (*)    Alkaline Phosphatase 369 (*)    All other components within normal limits  URINALYSIS COMPLETEWITH MICROSCOPIC ([facility] ONLY) - Abnormal; Notable for the following:    Color, Urine YELLOW (*)    APPearance CLEAR (*)    Leukocytes, UA 2+ (*)    Bacteria, UA RARE (*)    Squamous Epithelial / LPF 0-5 (*)    All other components within normal limits  LIPASE, BLOOD   TROPONIN I   ____________________________________________  EKG  ED ECG REPORT I, Doran Stabler, the attending physician, personally viewed and interpreted this ECG.   Date: 01/22/2015  EKG Time: 12:36 AM  Rate: 82  Rhythm: normal EKG, normal sinus rhythm  Axis: Normal axis  Intervals:none  ST&T Change: No ST segment elevation or depression. Single T-wave inversion in aVL which is unchanged from 09/06/2014. ____________________________________________  RADIOLOGY  Chest x-ray with emphysema and chronic small right pleural effusion. CT head without any acute intracranial pathology. CAT scan of the abdomen with new and innumerable large masses throughout the liver. Scattered vague lucencies throughout the visualized osseous structures are nonspecific but could reflect metastatic disease. Mildly loculated small right pleural effusion again noted. 1.2 cm focus of soft tissue density in the left side of the bladder may reflect malignancy. ____________________________________________   PROCEDURES   ____________________________________________   INITIAL IMPRESSION / ASSESSMENT AND PLAN / ED COURSE  Pertinent labs & imaging results that were available during my care of the patient were reviewed by me and considered in my medical decision making (see chart for details).  ----------------------------------------- 5:12 AM on 01/22/2015 -----------------------------------------  Patient family now requesting hospice evaluation. I think this is reasonable given the patient's advanced disease state. Discussed with hospice worker Jeannine Kitten, who says that a hospice representative will come to the emergency department to evaluate the patient. Awaiting their evaluation at this time. Patient and family are aware of the plan.  ----------------------------------------- 7:26 AM on 01/22/2015 -----------------------------------------  At this time we are still pending disposition per hospice.  Signed out to Dr. Thomasene Lot. ____________________________________________   FINAL CLINICAL IMPRESSION(S) / ED DIAGNOSES  Left lower quadrant pain secondary to Metastatic cancer.    Orbie Pyo, MD 01/22/15 [phone]  Atelectasis first mild infection seen on CAT scan in the chest. However patient says she is not short of breath and has not had a cough or any sputum production. Likely atelectasis.  Orbie Pyo, MD 01/22/15 [phone]

## 2015-01-22 NOTE — ED Notes (Signed)
Patient transported to X-ray 

## 2015-01-22 NOTE — ED Notes (Signed)
Comes into the ED via EMS from home c/o nausea and mild abdominal pain.  Patient has h/o lung cancer with mets.  States "I have not slept in 3 days" and "I am hearing someone sing me amazing grace".  Patient CBg 102, 89 HR, 186/81, 96 % at 4L.  Patient wears 3L at home and h/o COPD.

## 2015-01-22 NOTE — ED Notes (Signed)
MD at bedside. 

## 2015-01-22 NOTE — ED Notes (Signed)
Pt asked for egg biscuit. Dietary order placed and notified. Dietary states they will bring it to pt.

## 2015-01-22 NOTE — ED Notes (Signed)
Hospice nurse at bedside at this time to assess patient.

## 2015-02-10 DEATH — deceased

## 2016-07-08 IMAGING — CR DG CHEST 2V
1 series · 2 of 2 positions shown · non-contrast
Comparison: Most recent available comparison 11/06/2013.

CLINICAL DATA: Altered mental status. Left lower quadrant abdominal
pain

EXAM:
CHEST  2 VIEW

[Series 1: dg chest 2 view · 0.14mm/px · 2 of 2 slices shown]
[im 1/2]
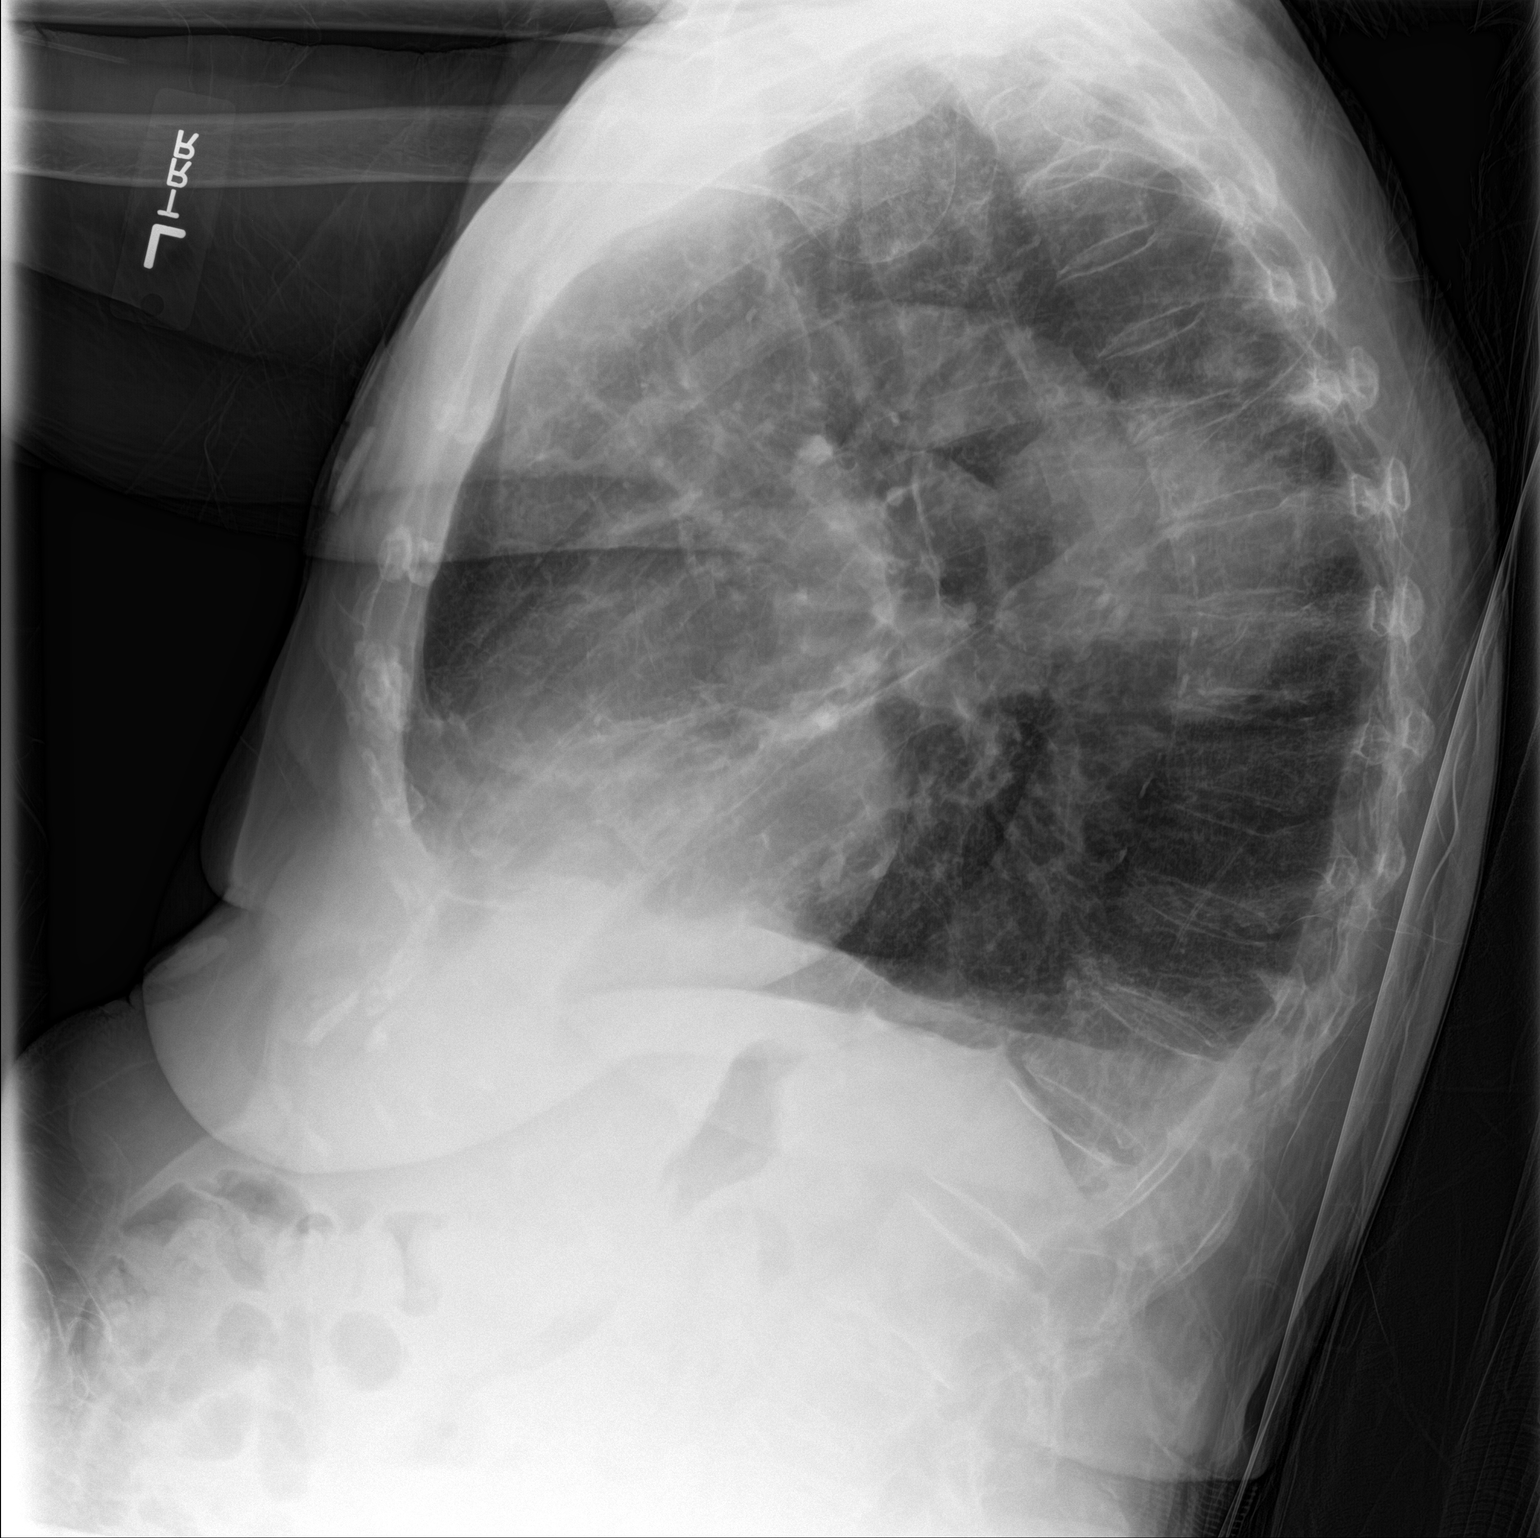
[im 2/2]
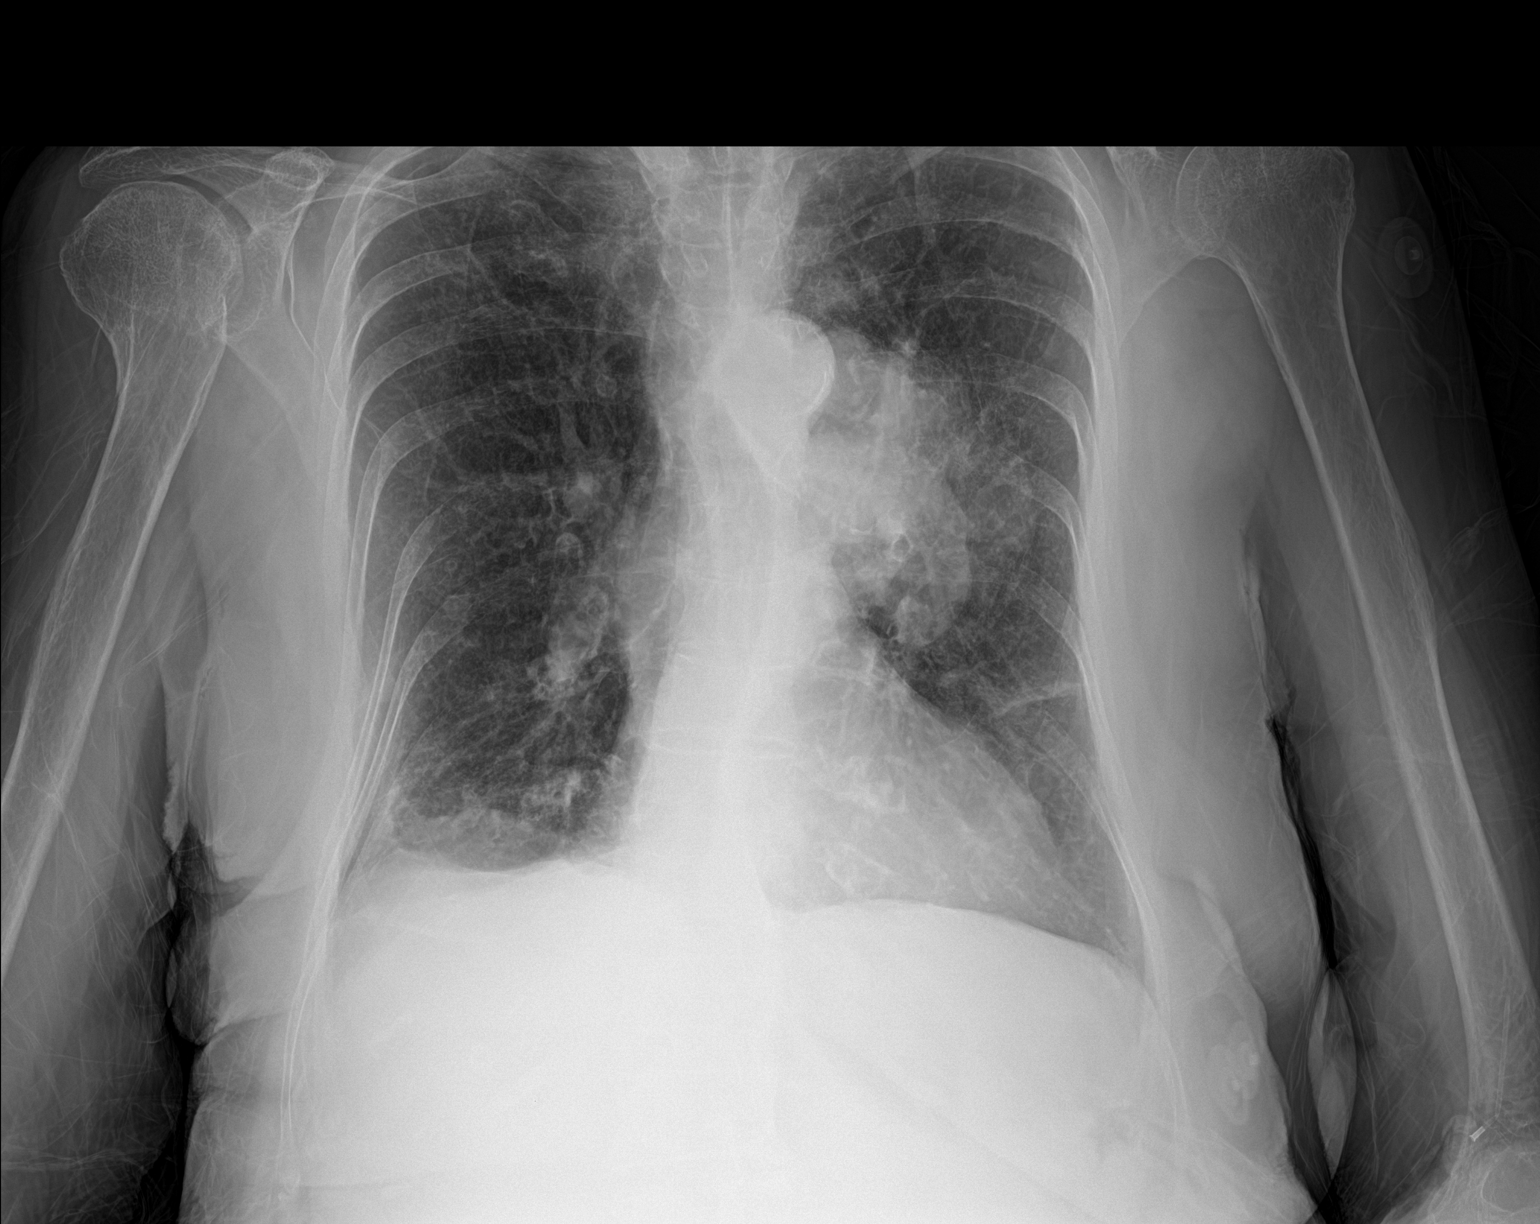

[2 of 2 positions shown; findings below may reference images not displayed]

FINDINGS: There is a large left hilar mass which has grown since 6619, as
reported on 09/30/2014 study. Small right pleural effusion.

COPD with emphysematous changes at the apices. Mild scarring at the
left base. There is no edema, consolidation, or pneumothorax. Normal
heart size and stable aortic tortuosity.

Osteopenia. Remote right humeral neck fracture. No acute osseous
finding.
IMPRESSION: 1. Emphysema and known large left hilar mass.
2. Chronic small right pleural effusion.

## 2016-07-08 IMAGING — CT CT ABD-PELV W/ CM
2 of 5 series · 15 of 46 positions shown, 17 images · IV contrast (omnipaque)
Comparison: CT of the abdomen and pelvis from 09/07/2014

CLINICAL DATA: Mid acute onset of nausea and mild generalized
abdominal pain. Current history of lung cancer with metastases.
Initial encounter.

EXAM:
CT ABDOMEN AND PELVIS WITH CONTRAST
TECHNIQUE: Multidetector CT imaging of the abdomen and pelvis was performed
using the standard protocol following bolus administration of
intravenous contrast.
CONTRAST:  100mL OMNIPAQUE IOHEXOL 300 MG/ML  SOLN

[Series 2: routine abd pel with · axial · 0.74mm/px · z∈[-342,+8]mm · 12 of 81 slices shown, 14 images]
[im 6/81  soft-tissue]
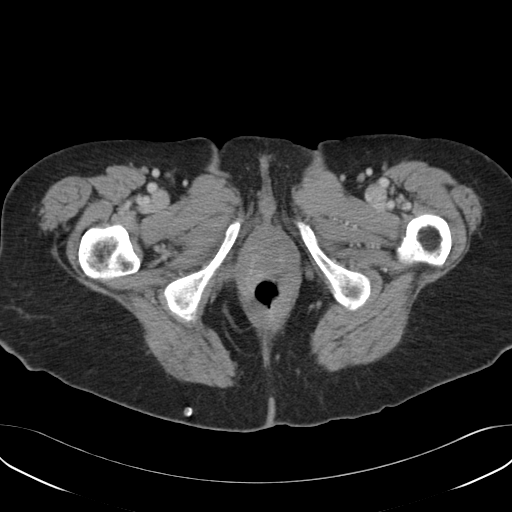
[im 6/81  bone]
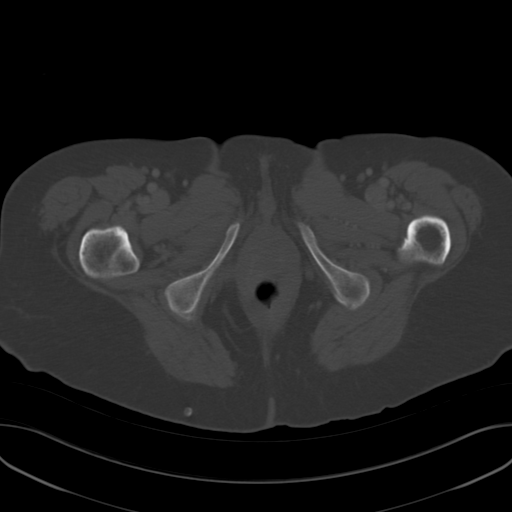
[im 11/81  soft-tissue]
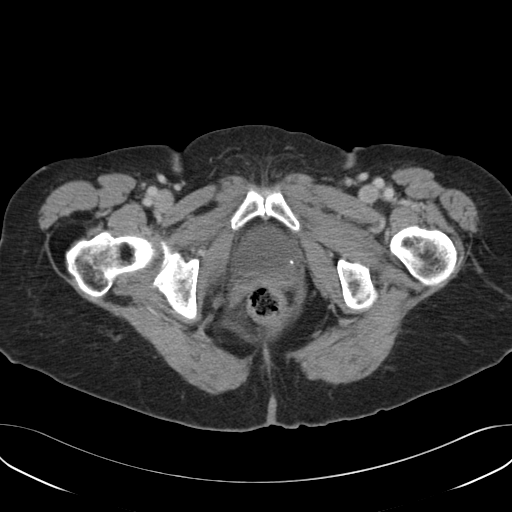
[im 21/81  soft-tissue]
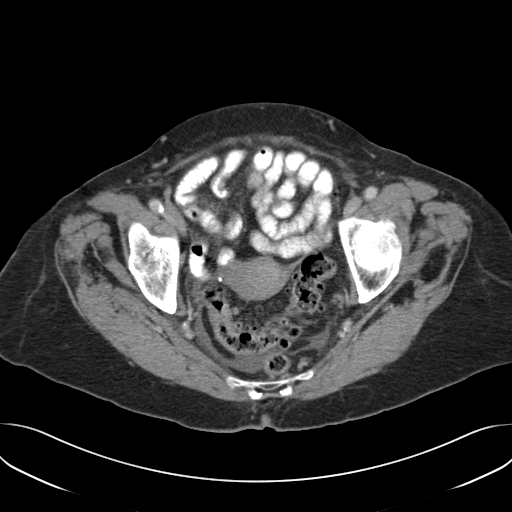
[im 26/81  soft-tissue]
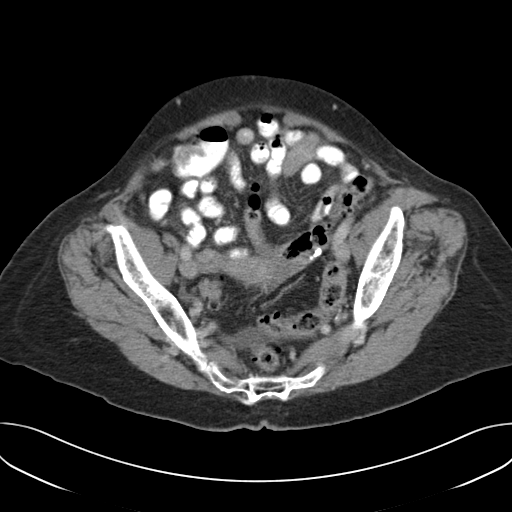
[im 31/81  soft-tissue]
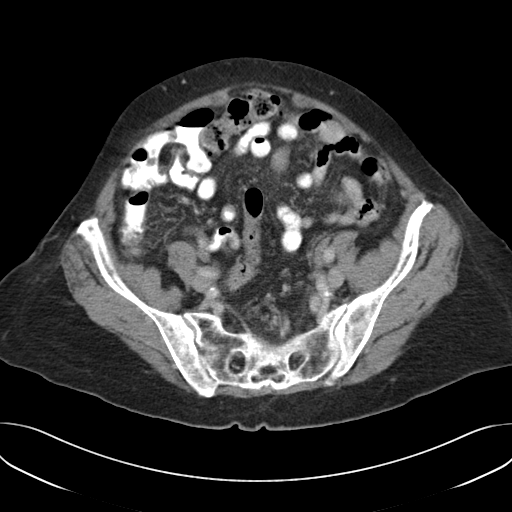
[im 36/81  soft-tissue]
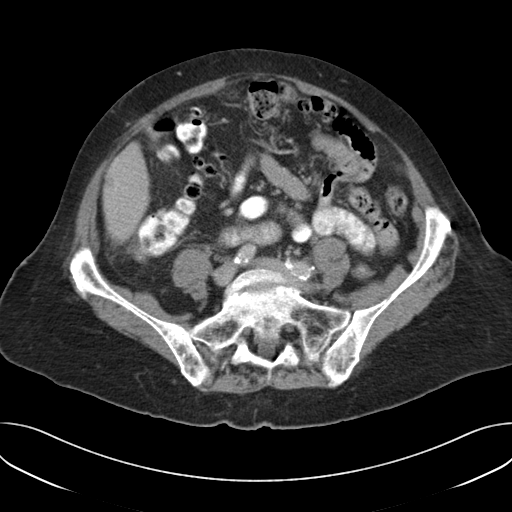
[im 46/81  soft-tissue]
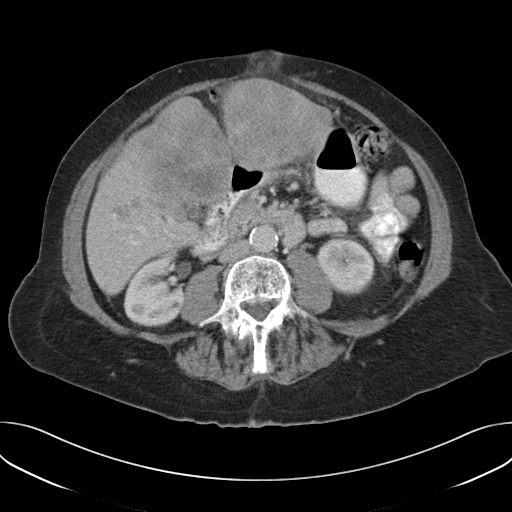
[im 51/81  soft-tissue]
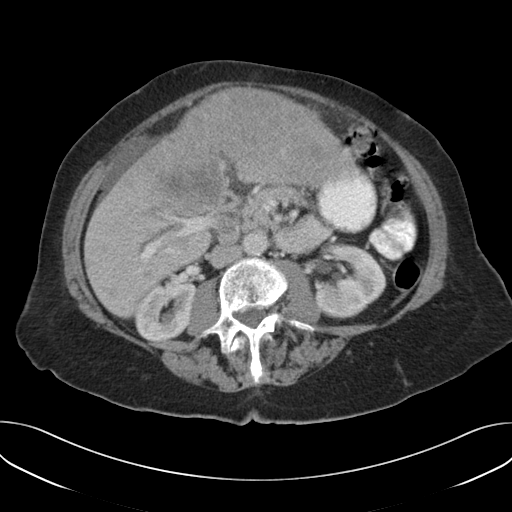
[im 56/81  soft-tissue]
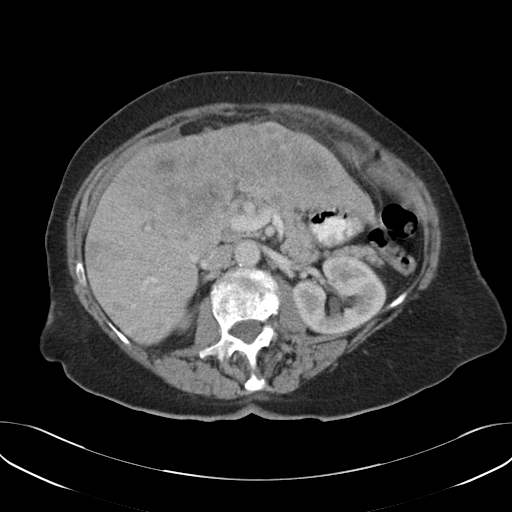
[im 56/81  bone]
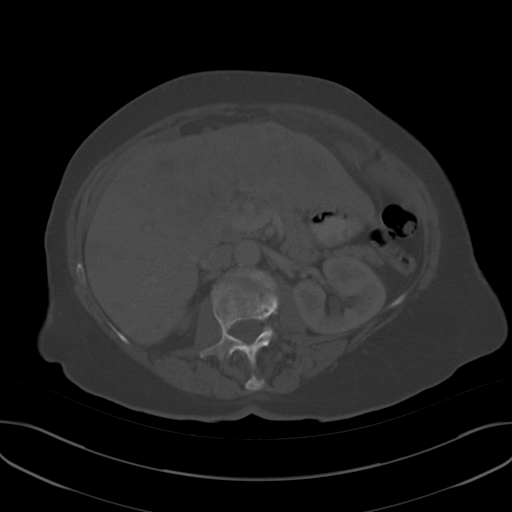
[im 61/81  soft-tissue]
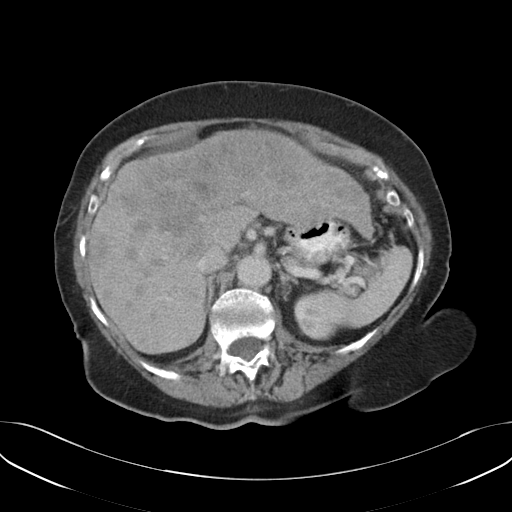
[im 71/81  soft-tissue]
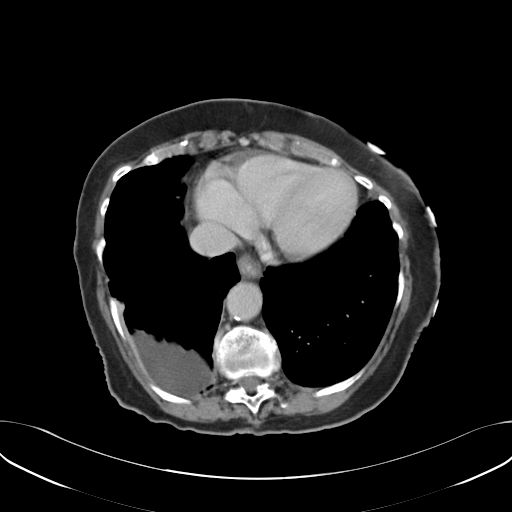
[im 76/81  soft-tissue]
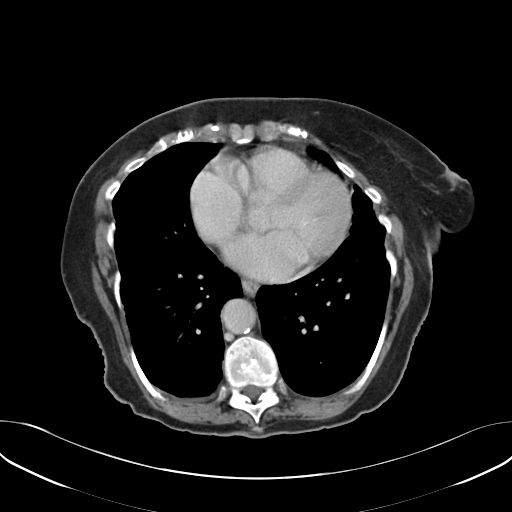

[Series 6: cor routine abd pel with · coronal · 0.89mm/px · 3 of 140 slices shown]
[im 47/140  soft-tissue]
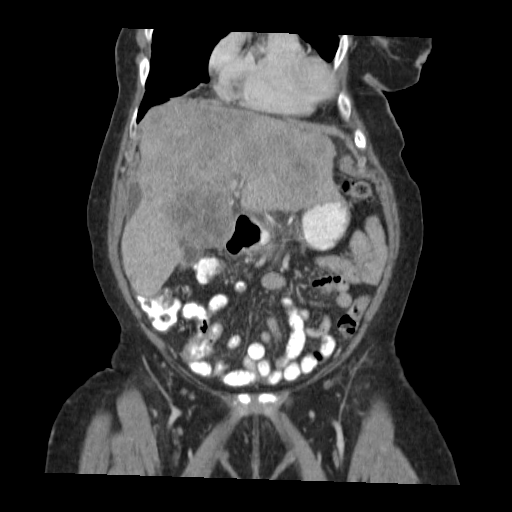
[im 62/140  soft-tissue]
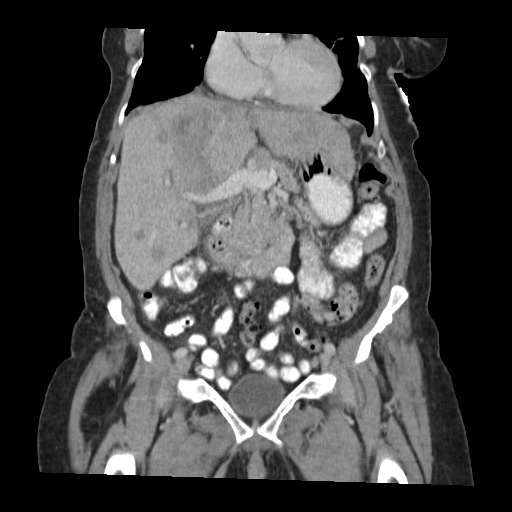
[im 78/140  soft-tissue]
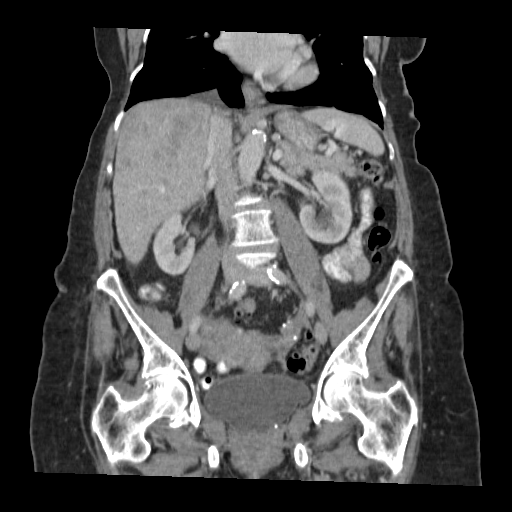

[15 of 46 positions shown; findings below may reference images not displayed]

FINDINGS: A mildly loculated small right pleural effusion is noted. Associated
mild right basilar and left lingular airspace opacities may reflect
atelectasis or possibly mild infection. A 6 mm nodule at the right
middle lobe is concerning for malignancy, given the patient's known
lung metastases.

Innumerable large masses are noted throughout the liver, compatible
with metastatic disease. Trace associated ascites is noted. The
spleen is unremarkable in appearance. Trace pericholecystic fluid is
nonspecific in the presence of ascites. A small stone is noted at
the gallbladder neck, without evidence for obstruction. The pancreas
and adrenal glands are unremarkable in appearance.

Scattered small nonobstructing bilateral renal stones are seen,
measuring up to 3 mm in size, more prominent on the right. Mild
left-sided pelvicaliectasis remains within normal limits, without
evidence of hydronephrosis. No obstructing ureteral stones are
identified. No perinephric stranding is seen. A few tiny bilateral
renal cysts are suggested.

The small bowel is unremarkable in appearance. The stomach is within
normal limits. No acute vascular abnormalities are seen. Scattered
calcification is noted along the abdominal aorta and its branches.

The patient is status post appendectomy. Scattered diverticulosis is
noted along the descending and sigmoid colon, without evidence of
diverticulitis. Trace free fluid is seen within the pelvis.

The bladder is mildly distended. A 1.2 cm focus of soft tissue
density at the left side of the bladder again may reflect a small
malignancy, though it is only minimally changed in size from [place].
The uterus is unremarkable in appearance. The ovaries are grossly
symmetric. No suspicious adnexal masses are seen. No inguinal
lymphadenopathy is seen.

Scattered vague lucencies noted throughout the visualized osseous
structures raise question for metastatic disease. There is mild
grade 1 anterolisthesis of L5 on S1, reflecting underlying facet
disease.
IMPRESSION: 1. New innumerable large masses throughout the liver, compatible
with metastatic disease to the liver. Trace associated ascites
noted, extending into the pelvis.
2. Scattered vague lucencies throughout the visualized osseous
structures are nonspecific, but could reflect metastatic disease.
3. Mildly loculated small right pleural effusion again noted.
Associated mild right basilar and left lingular airspace opacities
may reflect atelectasis or possibly mild infection. 6 mm nodule at
the right middle lobe is concerning for malignancy, given the
patient's known lung metastases.
4. 1.2 cm focus of soft tissue density at the left side of the
bladder again may reflect a small malignancy, though it is only
minimally changed in size from [DATE]. Cholelithiasis; gallbladder otherwise unremarkable.
6. Scattered small nonobstructing bilateral renal stones, measuring
up to 3 mm in size, more prominent on the right. Few tiny bilateral
renal cysts suggested.
7. Scattered calcification along the abdominal aorta and its
branches.
8. Scattered diverticulosis along the descending and sigmoid colon,
without evidence of diverticulitis.
